# Patient Record
Sex: Female | Born: 1955 | Race: White | Hispanic: No | Smoking: Current every day smoker
Health system: Southern US, Community
[De-identification: ages and names within clinical notes are randomized; demographics above are authoritative.]

## PROBLEM LIST (undated history)

## (undated) DIAGNOSIS — F32A Depression, unspecified: Secondary | ICD-10-CM

## (undated) DIAGNOSIS — I429 Cardiomyopathy, unspecified: Secondary | ICD-10-CM

## (undated) DIAGNOSIS — R569 Unspecified convulsions: Secondary | ICD-10-CM

## (undated) DIAGNOSIS — E785 Hyperlipidemia, unspecified: Secondary | ICD-10-CM

## (undated) DIAGNOSIS — I639 Cerebral infarction, unspecified: Secondary | ICD-10-CM

## (undated) DIAGNOSIS — F419 Anxiety disorder, unspecified: Secondary | ICD-10-CM

## (undated) DIAGNOSIS — F329 Major depressive disorder, single episode, unspecified: Secondary | ICD-10-CM

## (undated) DIAGNOSIS — G2581 Restless legs syndrome: Secondary | ICD-10-CM

## (undated) DIAGNOSIS — Z87898 Personal history of other specified conditions: Secondary | ICD-10-CM

## (undated) DIAGNOSIS — G43909 Migraine, unspecified, not intractable, without status migrainosus: Secondary | ICD-10-CM

## (undated) DIAGNOSIS — Z720 Tobacco use: Secondary | ICD-10-CM

## (undated) DIAGNOSIS — G459 Transient cerebral ischemic attack, unspecified: Secondary | ICD-10-CM

## (undated) DIAGNOSIS — K219 Gastro-esophageal reflux disease without esophagitis: Secondary | ICD-10-CM

## (undated) HISTORY — DX: Unspecified convulsions: R56.9

## (undated) HISTORY — DX: Cerebral infarction, unspecified: I63.9

## (undated) HISTORY — DX: Cardiomyopathy, unspecified: I42.9

## (undated) HISTORY — DX: Hyperlipidemia, unspecified: E78.5

## (undated) HISTORY — DX: Migraine, unspecified, not intractable, without status migrainosus: G43.909

## (undated) HISTORY — DX: Personal history of other specified conditions: Z87.898

## (undated) HISTORY — DX: Restless legs syndrome: G25.81

## (undated) HISTORY — DX: Major depressive disorder, single episode, unspecified: F32.9

## (undated) HISTORY — DX: Depression, unspecified: F32.A

## (undated) HISTORY — DX: Transient cerebral ischemic attack, unspecified: G45.9

## (undated) HISTORY — DX: Tobacco use: Z72.0

## (undated) HISTORY — DX: Anxiety disorder, unspecified: F41.9

## (undated) HISTORY — DX: Gastro-esophageal reflux disease without esophagitis: K21.9

---

## 2004-11-10 ENCOUNTER — Emergency Department: Payer: Self-pay | Admitting: Internal Medicine

## 2006-02-01 ENCOUNTER — Other Ambulatory Visit: Payer: Self-pay

## 2006-02-01 ENCOUNTER — Inpatient Hospital Stay: Payer: Self-pay | Admitting: Internal Medicine

## 2007-03-21 ENCOUNTER — Inpatient Hospital Stay: Payer: Self-pay | Admitting: Internal Medicine

## 2007-03-21 ENCOUNTER — Other Ambulatory Visit: Payer: Self-pay

## 2007-03-22 ENCOUNTER — Other Ambulatory Visit: Payer: Self-pay

## 2007-04-23 ENCOUNTER — Other Ambulatory Visit: Payer: Self-pay

## 2007-04-23 ENCOUNTER — Emergency Department: Payer: Self-pay | Admitting: Emergency Medicine

## 2012-03-31 DIAGNOSIS — G459 Transient cerebral ischemic attack, unspecified: Secondary | ICD-10-CM

## 2012-03-31 HISTORY — DX: Transient cerebral ischemic attack, unspecified: G45.9

## 2012-07-06 ENCOUNTER — Inpatient Hospital Stay: Payer: Self-pay | Admitting: Internal Medicine

## 2012-07-06 DIAGNOSIS — I059 Rheumatic mitral valve disease, unspecified: Secondary | ICD-10-CM

## 2012-07-06 LAB — COMPREHENSIVE METABOLIC PANEL
Alkaline Phosphatase: 112 U/L (ref 50–136)
Anion Gap: 6 — ABNORMAL LOW (ref 7–16)
Bilirubin,Total: 0.4 mg/dL (ref 0.2–1.0)
Calcium, Total: 9.3 mg/dL (ref 8.5–10.1)
Chloride: 107 mmol/L (ref 98–107)
Co2: 28 mmol/L (ref 21–32)
EGFR (African American): >60
Osmolality: 284 (ref 275–301)
SGPT (ALT): 15 U/L (ref 12–78)
Sodium: 141 mmol/L (ref 136–145)
Total Protein: 7.7 g/dL (ref 6.4–8.2)

## 2012-07-06 LAB — CBC
HCT: 45.7 % (ref 35.0–47.0)
HGB: 15.5 g/dL (ref 12.0–16.0)
MCH: 31 pg (ref 26.0–34.0)
MCHC: 33.9 g/dL (ref 32.0–36.0)
MCV: 91 fL (ref 80–100)
Platelet: 202 10*3/uL (ref 150–440)
RBC: 5 10*6/uL (ref 3.80–5.20)
RDW: 14.2 % (ref 11.5–14.5)
WBC: 8.1 10*3/uL (ref 3.6–11.0)

## 2012-07-06 LAB — HCG, QUANTITATIVE, PREGNANCY: Beta Hcg, Quant.: 4 m[IU]/mL — ABNORMAL HIGH

## 2012-07-06 LAB — DRUG SCREEN, URINE
Amphetamines, Ur Screen: NEGATIVE (ref ?–1000)
Barbiturates, Ur Screen: NEGATIVE (ref ?–200)
Cannabinoid 50 Ng, Ur ~~LOC~~: NEGATIVE (ref ?–50)
Cocaine Metabolite,Ur ~~LOC~~: NEGATIVE (ref ?–300)
MDMA (Ecstasy)Ur Screen: NEGATIVE (ref ?–500)
Methadone, Ur Screen: NEGATIVE (ref ?–300)
Tricyclic, Ur Screen: NEGATIVE (ref ?–1000)

## 2012-07-06 LAB — URINALYSIS, COMPLETE
Bilirubin,UR: NEGATIVE
Leukocyte Esterase: NEGATIVE
Ph: 7 (ref 4.5–8.0)
Protein: NEGATIVE
RBC,UR: <1 /HPF (ref 0–5)
Specific Gravity: 1.003 (ref 1.003–1.030)
Squamous Epithelial: <1
WBC UR: NONE SEEN /HPF (ref 0–5)

## 2012-07-06 LAB — CK TOTAL AND CKMB (NOT AT ARMC): CK-MB: 0.8 ng/mL (ref 0.5–3.6)

## 2012-07-06 LAB — TROPONIN I: Troponin-I: <0.02 ng/mL

## 2012-07-07 ENCOUNTER — Telehealth: Payer: Self-pay

## 2012-07-07 LAB — LIPID PANEL
HDL Cholesterol: 44 mg/dL (ref 40–60)
Ldl Cholesterol, Calc: 196 mg/dL — ABNORMAL HIGH (ref 0–100)
Triglycerides: 142 mg/dL (ref 0–200)

## 2012-07-07 LAB — HEMOGLOBIN A1C: Hemoglobin A1C: 5.9 % (ref 4.2–6.3)

## 2012-07-07 NOTE — Telephone Encounter (Signed)
Message copied by [facility], Elier Zellars E on Wed Jul 07, 2012  2:10 PM ------      Message from: Coralee Rud      Created: Wed Jul 07, 2012  2:04 PM      Regarding: tcm       appt 07/09/12 with Dr Mariah Milling ------

## 2012-07-07 NOTE — Telephone Encounter (Signed)
tcm

## 2012-07-08 NOTE — Telephone Encounter (Signed)
TCM Pt d/c yesterday for [id number] infarct and cardiomyiopathy Denies worsening sob, edema, dizziness or CP Confirms compliance with medications as prescribed Has appt with [place] tomm Will call us prior to appt if needed

## 2012-07-09 ENCOUNTER — Encounter: Payer: Self-pay | Admitting: Cardiovascular Disease

## 2012-07-09 ENCOUNTER — Ambulatory Visit (INDEPENDENT_AMBULATORY_CARE_PROVIDER_SITE_OTHER): Payer: BC Managed Care – PPO | Admitting: Cardiovascular Disease

## 2012-07-09 VITALS — BP 122/72 | HR 89 | Ht 61.0 in | Wt 138.0 lb

## 2012-07-09 DIAGNOSIS — I639 Cerebral infarction, unspecified: Secondary | ICD-10-CM

## 2012-07-09 DIAGNOSIS — I6350 Cerebral infarction due to unspecified occlusion or stenosis of unspecified cerebral artery: Secondary | ICD-10-CM

## 2012-07-09 DIAGNOSIS — Z01812 Encounter for preprocedural laboratory examination: Secondary | ICD-10-CM

## 2012-07-09 DIAGNOSIS — I422 Other hypertrophic cardiomyopathy: Secondary | ICD-10-CM

## 2012-07-09 DIAGNOSIS — Z8673 Personal history of transient ischemic attack (TIA), and cerebral infarction without residual deficits: Secondary | ICD-10-CM | POA: Insufficient documentation

## 2012-07-09 DIAGNOSIS — R569 Unspecified convulsions: Secondary | ICD-10-CM | POA: Insufficient documentation

## 2012-07-09 DIAGNOSIS — R002 Palpitations: Secondary | ICD-10-CM

## 2012-07-09 DIAGNOSIS — E785 Hyperlipidemia, unspecified: Secondary | ICD-10-CM

## 2012-07-09 MED ORDER — CARVEDILOL 6.25 MG PO TABS: 6.2500 mg | ORAL_TABLET | Freq: Two times a day (BID) | ORAL | Status: DC

## 2012-07-09 NOTE — Assessment & Plan Note (Signed)
She does report palpitations and she wear a monitor at work early next week. Concerning for atrial fibrillation

## 2012-07-09 NOTE — Assessment & Plan Note (Signed)
History suggest four strokes with good recovery dating back to 2006. Etiology is still uncertain. We will try to obtain a TEE from [facility].I'm concerned about PFO. She does have palpitations raising the concern of atrial fibrillation. We had a long discussion with her about this and she will wear a today monitor, likely will need 30 day monitor as well. She will continue on aspirin and Plavix. If she has stroke on aspirin Plavix, would likely need warfarin. Carotid scan showing no significant stenosis.

## 2012-07-09 NOTE — Assessment & Plan Note (Addendum)
Moderately depressed ejection fraction on recent echocardiogram. We'll order a ischemic workup. Treadmill Myoview ordered. Higher risk given her smoking history.she will continue lisinopril, we will start Coreg 6.25 mg twice a day. We have discussed heart failure with her and she will watch her fluid and salt intake.

## 2012-07-09 NOTE — Patient Instructions (Addendum)
We will schedule you for a stress test on Tuesday 4/15 in the AM We will order a 48 hour monitor for CVA, palpitations  Please start coreg 6.25 mg twice a day (start after your stress test) Continue all other medications  Please call us if you have new issues that need to be addressed before your next appt.  Your physician wants you to follow-up in: 1 month.

## 2012-07-09 NOTE — Assessment & Plan Note (Signed)
Very high cholesterol and LDL. We have suggested she stay on her statin. We'll recheck her numbers in several months.

## 2012-07-09 NOTE — Progress Notes (Signed)
Patient ID: Vicki Rosales, female    DOB: January 03, 1956, 57 y.o.   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 She seemed to recover well. She was not on aspirin or Plavix at the time.  Second stroke in December 2008 with left-sided deficits and speech problem. Again seemed to recover well. She was not on aspirin or Plavix at the time.she had seizures after the stroke and was started on Keppra. She did not tolerate Aggrenox and was discharged on aspirin, Plavix.  She stopped taking aspirin and Plavix on her own Another stroke in 2009 care for at [facility]. She reports having TEE done at this hospital  Recent admission to the hospital 07/06/2012 for acute ischemia of the left posterior parietal lobe, right-sided deficits of her hand with speech issues Ejection fraction on echo was 35-40%, mild to moderate diffuse hypokinesis, diastolic dysfunction, normal right ventricular systolic pressures. She denies any significant shortness of breath, chest pain.  EKG in the hospital showed normal sinus rhythm with left anterior fascicular block, poor R-wave progression to the anterior precordial leads  EKG today shows normal sinus rhythm with rate 89 beats per minuteWith poor R-wave progression, left anterior fascicular block  She has hyperlipidemia with LDL 196, total cholesterol 268 MRI of the brain and neck did not show any significant carotid arterial disease She was discharged on aspirin and Plavix. Symptoms seemed to resolve after 4 hours  Outpatient Encounter Prescriptions as of 07/09/2012  Medication Sig Dispense Refill  . aspirin 81 MG tablet Take 81 mg by mouth  daily.      . clopidogrel (PLAVIX) 75 MG tablet Take 75 mg by mouth daily.      Marland Kitchen lisinopril (PRINIVIL,ZESTRIL) 10 MG tablet Take 10 mg by mouth daily.      Marland Kitchen LORazepam (ATIVAN) 0.5 MG tablet Take 0.5 mg by mouth every 8 (eight) hours.      . nicotine (NICODERM CQ - DOSED IN MG/24 HOURS) 14 mg/24hr patch Place 1 patch onto the skin daily.      Marland Kitchen omeprazole (PRILOSEC) 20 MG capsule Take 20 mg by mouth daily.      Marland Kitchen rOPINIRole (REQUIP) 3 MG tablet Take 0.5 mg by mouth at bedtime.      . sertraline (ZOLOFT) 100 MG tablet Take 100 mg by mouth daily.       . simvastatin (ZOCOR) 40 MG tablet Take 40 mg by mouth every evening.         Review of Systems  Constitutional: Negative.   HENT: Negative.   Eyes: Negative.   Respiratory: Negative.   Cardiovascular: Negative.   Gastrointestinal: Negative.   Musculoskeletal: Negative.   Skin: Negative.   Neurological: Negative.        Recent right arm  and speech deficits from stroke, resolved  Psychiatric/Behavioral: Negative.   All other systems reviewed and are negative.    BP 122/72  Pulse 89  Ht 5\' 1"  (1.549 m)  Wt 138 lb (62.596 kg)  BMI 26.09 kg/m2  Physical Exam  Nursing note and vitals reviewed. Constitutional: She is oriented to person, place, and time. She appears well-developed and well-nourished.  HENT:  Head: Normocephalic.  Nose: Nose normal.  Mouth/Throat: Oropharynx is clear and moist.  Eyes: Conjunctivae are normal. Pupils are equal, round, and reactive to light.  Neck: Normal range of motion. Neck supple. No JVD present.  Cardiovascular: Normal rate, regular rhythm, S1 normal, S2 normal, normal heart sounds and intact distal pulses.  Exam reveals no gallop and no friction rub.   No murmur heard. Pulmonary/Chest: Effort normal and breath sounds normal. No respiratory distress. She has no wheezes. She has no rales. She exhibits no tenderness.  Abdominal: Soft. Bowel sounds are normal. She exhibits no distension. There is  no tenderness.  Musculoskeletal: Normal range of motion. She exhibits no edema and no tenderness.  Lymphadenopathy:    She has no cervical adenopathy.  Neurological: She is alert and oriented to person, place, and time. Coordination normal.  Skin: Skin is warm and dry. No rash noted. No erythema.  Psychiatric: She has a normal mood and affect. Her behavior is normal. Judgment and thought content normal.    Assessment and Plan

## 2012-07-09 NOTE — Assessment & Plan Note (Signed)
She denies any recent seizure

## 2012-07-19 ENCOUNTER — Ambulatory Visit: Payer: Self-pay | Admitting: Cardiovascular Disease

## 2012-07-19 DIAGNOSIS — R079 Chest pain, unspecified: Secondary | ICD-10-CM

## 2012-07-20 ENCOUNTER — Telehealth: Payer: Self-pay | Admitting: Physician Assistant

## 2012-07-20 ENCOUNTER — Other Ambulatory Visit: Payer: Self-pay

## 2012-07-20 DIAGNOSIS — I639 Cerebral infarction, unspecified: Secondary | ICD-10-CM

## 2012-07-20 DIAGNOSIS — R002 Palpitations: Secondary | ICD-10-CM

## 2012-07-20 NOTE — Telephone Encounter (Signed)
Patient called after-hours regarding itching all over. This started yesterday evening. She thought it would get better by now but it hasn't. She reports undergoing pharmacologic stress testing yesterday. Medications include zoloft (chronic for many years), and more recently addition of Plavix, Lisinopril and Simvastatin on 07/06/2012. She has not changed soaps or laundry detergent lately. No other known allergies. No respiratory distress, wheezing, SOB, throat/lip/mouth swelling, visible skin reaction, swelling, redness, or any other symptoms, just diffuse itching. I advised she take 25mg  of oral benadryl now with 40mg  of oral pepcid. If itching is not better within 1 hour, she is to take an additional 25mg  of benadryl. I also advised to proceed to ER if she develops any warning signs including progression of reaction, SOB/resp symptoms as above, or any other symptoms concerning to her. As far as evening medicines go, she is due for lisinopril and simvastatin - I instructed her to hold these medications until we can get the itching under control. She is to call the office in the AM for an update on how rash is doing and further instructions on medication continuation. She read these instructions back to me and verbalized understanding and gratitude.

## 2012-07-21 ENCOUNTER — Telehealth: Payer: Self-pay | Admitting: *Deleted

## 2012-07-21 NOTE — Telephone Encounter (Signed)
Pt calling for stress test results °

## 2012-08-03 ENCOUNTER — Encounter (INDEPENDENT_AMBULATORY_CARE_PROVIDER_SITE_OTHER): Payer: BC Managed Care – PPO

## 2012-08-03 DIAGNOSIS — R002 Palpitations: Secondary | ICD-10-CM

## 2012-08-09 ENCOUNTER — Other Ambulatory Visit: Payer: Self-pay

## 2012-08-09 MED ORDER — CLOPIDOGREL BISULFATE 75 MG PO TABS: 75.0000 mg | ORAL_TABLET | Freq: Every day | ORAL | Status: DC

## 2012-08-09 NOTE — Telephone Encounter (Signed)
Refilled Clopidogrel sent to [facility] pharmacy.

## 2012-08-16 ENCOUNTER — Encounter: Payer: Self-pay | Admitting: Cardiovascular Disease

## 2012-08-16 ENCOUNTER — Ambulatory Visit (INDEPENDENT_AMBULATORY_CARE_PROVIDER_SITE_OTHER): Payer: BC Managed Care – PPO | Admitting: Cardiovascular Disease

## 2012-08-16 VITALS — BP 120/80 | HR 81 | Ht 60.0 in | Wt 138.2 lb

## 2012-08-16 DIAGNOSIS — E785 Hyperlipidemia, unspecified: Secondary | ICD-10-CM

## 2012-08-16 DIAGNOSIS — I639 Cerebral infarction, unspecified: Secondary | ICD-10-CM

## 2012-08-16 DIAGNOSIS — I6350 Cerebral infarction due to unspecified occlusion or stenosis of unspecified cerebral artery: Secondary | ICD-10-CM

## 2012-08-16 DIAGNOSIS — R002 Palpitations: Secondary | ICD-10-CM

## 2012-08-16 DIAGNOSIS — F17200 Nicotine dependence, unspecified, uncomplicated: Secondary | ICD-10-CM | POA: Insufficient documentation

## 2012-08-16 NOTE — Assessment & Plan Note (Signed)
We have encouraged her to continue to work on weaning her cigarettes and smoking cessation. She will continue to work on this and does not want any assistance with chantix.  

## 2012-08-16 NOTE — Assessment & Plan Note (Addendum)
encouraged her to stay on simvastatin

## 2012-08-16 NOTE — Progress Notes (Signed)
Patient ID: Vicki Rosales, female    DOB: 03/13/1956, 57 y.o.   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 notes indicate history of seizure,  long history of smoking from age [age], total of 37 years   initial stroke in 2007 she had difficulty with speech. She seemed to recover well. She was not on aspirin or Plavix at the time. Second stroke in December 2008 with left-sided deficits and speech problem. Again seemed to recover well. She was not on aspirin or Plavix at the time.she had seizures after the stroke and was started on Keppra. She did not tolerate Aggrenox and was discharged on aspirin, Plavix.  She stopped taking aspirin and Plavix on her own. Another stroke in 2009, managed at [facility]. She reports having TEE done at this hospital. Details unavailable  Recent admission  07/06/2012 for acute ischemia of the left posterior parietal lobe, right-sided deficits of her hand with speech issues, documented on MRA Ejection fraction on echo was 35-40%, mild to moderate diffuse hypokinesis, diastolic dysfunction, normal right ventricular systolic pressures. Bubble study not performed. She was discharged on aspirin and Plavix. Symptoms seemed to resolve after 4 hours MRI of the brain and neck did not show any significant carotid arterial disease  She denies any significant shortness of breath, chest pain. Overall she reports on today's visit that she is doing well. Significant stress at home from financial issues Stress test in April 2014 showed no ischemia, ejection fraction greater than 50% She continues to smoke one half pack per day  EKG in the hospital showed normal sinus rhythm with left anterior fascicular block, poor R-wave progression to the anterior precordial leads  EKG today shows normal sinus rhythm with rate 81 beats per minute,  with poor R-wave progression, left anterior fascicular block  She has hyperlipidemia with LDL 196, total cholesterol 268    Outpatient Encounter Prescriptions as of 08/16/2012  Medication Sig Dispense Refill  . aspirin 81 MG tablet Take 81 mg by mouth daily.      . carvedilol (COREG) 6.25 MG tablet Take 1 tablet (6.25 mg total) by mouth 2 (two) times daily.  180 tablet  3  . clopidogrel (PLAVIX) 75 MG tablet Take 1 tablet (75 mg total) by mouth daily.  30 tablet  3  . lisinopril (PRINIVIL,ZESTRIL) 10 MG tablet Take 10 mg by mouth daily.      Marland Kitchen LORazepam (ATIVAN) 0.5 MG tablet Take 0.5 mg by mouth every 8 (eight) hours.      . nicotine (NICODERM CQ - DOSED IN MG/24 HOURS) 14 mg/24hr patch Place 1 patch onto the skin daily.      Marland Kitchen omeprazole (PRILOSEC) 20 MG capsule Take 20 mg by mouth daily.      Marland Kitchen rOPINIRole (REQUIP) 3 MG tablet Take 0.5 mg by mouth at bedtime.      . sertraline (ZOLOFT) 100 MG tablet Take 100 mg by mouth daily.       . simvastatin (ZOCOR) 40 MG tablet Take 40 mg by mouth every evening.       No facility-administered encounter medications on file as of 08/16/2012.   And  Review of Systems  Constitutional: Negative.   HENT: Negative.   Eyes: Negative.   Respiratory: Negative.   Cardiovascular: Negative.   Gastrointestinal: Negative.   Musculoskeletal: Negative.   Skin: Negative.  Neurological: Negative.        Recent right arm  and speech deficits from stroke, resolved  Psychiatric/Behavioral: The patient is nervous/anxious.   All other systems reviewed and are negative.    BP 100/60  Ht 5' (1.524 m)  Wt 138 lb 4 oz (62.71 kg)  BMI 27 kg/m2  Physical Exam  Nursing note and vitals reviewed. Constitutional: She is oriented to person, place, and time. She appears well-developed and well-nourished.  HENT:  Head: Normocephalic.  Nose: Nose normal.  Mouth/Throat: Oropharynx is clear and moist.  Eyes: Conjunctivae are normal. Pupils are equal, round, and  reactive to light.  Neck: Normal range of motion. Neck supple. No JVD present.  Cardiovascular: Normal rate, regular rhythm, S1 normal, S2 normal, normal heart sounds and intact distal pulses.  Exam reveals no gallop and no friction rub.   No murmur heard. Pulmonary/Chest: Effort normal and breath sounds normal. No respiratory distress. She has no wheezes. She has no rales. She exhibits no tenderness.  Abdominal: Soft. Bowel sounds are normal. She exhibits no distension. There is no tenderness.  Musculoskeletal: Normal range of motion. She exhibits no edema and no tenderness.  Lymphadenopathy:    She has no cervical adenopathy.  Neurological: She is alert and oriented to person, place, and time. Coordination normal.  Skin: Skin is warm and dry. No rash noted. No erythema.  Psychiatric: She has a normal mood and affect. Her behavior is normal. Judgment and thought content normal.    Assessment and Plan

## 2012-08-16 NOTE — Assessment & Plan Note (Signed)
We will try to obtain TEE records to rule out PFO or ASD. If she has additional episodes of CVA, will require warfarin. Encouraged her to stay on aspirin and Plavix

## 2012-08-16 NOTE — Patient Instructions (Addendum)
You are doing well. No medication changes were made.  Please call us if you have new issues that need to be addressed before your next appt.  Your physician wants you to follow-up in: 12 months.  You will receive a reminder letter in the mail two months in advance. If you don't receive a letter, please call our office to schedule the follow-up appointment. 

## 2012-12-07 ENCOUNTER — Other Ambulatory Visit: Payer: Self-pay | Admitting: Cardiovascular Disease

## 2012-12-07 NOTE — Telephone Encounter (Signed)
Refilled Plavix sent to [facility] pharmacy.

## 2013-04-11 ENCOUNTER — Other Ambulatory Visit: Payer: Self-pay | Admitting: *Deleted

## 2013-04-11 ENCOUNTER — Other Ambulatory Visit: Payer: Self-pay | Admitting: Cardiovascular Disease

## 2013-04-11 MED ORDER — CLOPIDOGREL BISULFATE 75 MG PO TABS: ORAL_TABLET | ORAL | Status: DC

## 2013-04-11 NOTE — Telephone Encounter (Signed)
Requested Prescriptions   Signed Prescriptions Disp Refills  . clopidogrel (PLAVIX) 75 MG tablet 30 tablet 3    Sig: TAKE ONE TABLET BY MOUTH ONCE DAILY    Authorizing Provider: Antonieta IbaGOLLAN, TIMOTHY J    Ordering User: Kendrick FriesLOPEZ, MARINA C

## 2013-11-11 ENCOUNTER — Other Ambulatory Visit: Payer: Self-pay | Admitting: Cardiovascular Disease

## 2013-11-11 ENCOUNTER — Telehealth: Payer: Self-pay

## 2013-11-11 NOTE — Telephone Encounter (Signed)
Pt needs to talk about her rx clopidogrel, and has some questions.

## 2013-11-11 NOTE — Telephone Encounter (Signed)
Spoke w pt.  She states that she is out of work and unable to afford her meds.  Due to this, she has not taken her Plavix in some time and cannot afford an appt.  Advised pt that she is overdue for an appt, as she has not been seen in a year and half, and although we cannot refill or change her meds, we can definitely work w/ her on an appt.  She will call back when she has a chance to check her schedule, as she has some upcoming interviews that she needs to work around.

## 2014-07-21 NOTE — H&P (Signed)
PATIENT NAME:  Vicki Rosales, Vicki Rosales MR#:  [id number] DATE OF BIRTH:  14-Oct-1955  DATE OF ADMISSION:  07/06/2012  PRIMARY CARE PHYSICIAN: Dr. Dossie Arbour.   CHIEF COMPLAINT: Right hand weakness, incoordination and slurred speech.   HISTORY OF PRESENTING ILLNESS: A 59 year old Caucasian female patient with prior CVA, tobacco abuse presents to the Emergency Room complaining of acute onset of right hand weakness and incoordination, along with slurred speech, which started about 7 a.m. today morning. This lasted 4 hours and resolved slowly after arriving at the Emergency Room. The patient felt like she could not coordinate with her right arm. Although she could lift her hands up to tie a hairband, she could not do it secondary to weakness in her hand and incoordination. Her slurred speech started immediately after that and lasted until she arrived at the Emergency Room. Did not have any other focal neurological deficits. No dysphagia, hearing loss, change in vision, fall, syncope, nausea, vomiting, headache, trouble breathing. The patient was last seen here at the hospital in December 2008 for an acute right frontotemporal ischemic stroke. The patient after the stroke had seizures, started on Keppra. The patient did not tolerate Aggrenox at that time and was discharged on aspirin, Plavix and Keppra.   The patient stopped taking the Plavix and Keppra, as she did not have any further seizures and was doing well. She reports that a few months back she had 2 episodes of syncope with some lightheadedness but nothing lately.   PAST MEDICAL HISTORY: 1.  CVA.  2.  Seizures.  3.  Hyperlipidemia.  4.  Tobacco abuse.  5.  Restless leg syndrome.  6.  Ocular migraines.   PAST SURGICAL HISTORY: None.   ALLERGIES: No known drug allergies.   SOCIAL HISTORY: Smokes half pack of cigarettes for over 30 years now. No alcohol. No illicit drugs. She works in Presenter, broadcasting at [employer].   FAMILY HISTORY: Mother had  hypertension. Sister has coronary artery disease and a stent placed at age 45.   CURRENT MEDICATIONS:  1.  Lorazepam 0.5 mg oral once a day as needed for anxiety.  2.  Omeprazole 20 mg oral once a day.  3.  Requip 3 mg half tablet oral once a day at bedtime.  4.  Sertraline 100 mg oral once a day.   REVIEW OF SYSTEMS:  CONSTITUTIONAL: No fever, fatigue, weakness.  EYES: No blurred vision, pain, redness.  ENT: No tinnitus, ear pain, hearing loss.  RESPIRATORY: No cough, wheeze, hemoptysis.  CARDIOVASCULAR: No chest pain, orthopnea, edema.  GASTROINTESTINAL: No nausea, vomiting, diarrhea, abdominal pain.  GENITOURINARY: No dysuria, hematuria, frequency.  ENDOCRINE: No polyuria, nocturia, thyroid problems.  HEMATOLOGY/LYMPHATIC: No anemia, easy bruising, bleeding.  INTEGUMENTARY: No acne, rash, lesions.  MUSCULOSKELETAL: No arthritis or back pain.  NEUROLOGICAL: Had right hand weakness, incoordination, slurred speech, which are resolved. Has a seizure disorder, but no seizures over the past 2 years.  PSYCHIATRIC: Has some anxiety. No depression.   PHYSICAL EXAMINATION: VITAL SIGNS: Temperature 98.6, pulse 78, respirations 20, blood pressure 147/81, saturating 100% on room air.  GENERAL: Moderately built Caucasian female patient lying in bed, comfortable, conversational, cooperative with exam.  PSYCHIATRIC: Alert, oriented x 3. Mood and affect appropriate. Judgment intact.  HEENT: Atraumatic, normocephalic. Oral mucosa moist and pink. External ears and nose normal. No pallor. No icterus. Pupils bilaterally equal and reactive to light.  NECK: Supple. No thyromegaly. No palpable lymph nodes. Trachea midline. No carotid bruit, JVD.  CARDIOVASCULAR: S1, S2. Regular rate  and rhythm without any murmurs. Peripheral pulses 2+.  RESPIRATORY: Normal work of breathing. Clear to auscultation on both sides.  GASTROINTESTINAL: Soft, nontender. Bowel sounds present. No hepatosplenomegaly palpable.   GENITOURINARY: No CVA tenderness or bladder distention.  SKIN: Warm and dry. No petechiae, rash, ulcers.  MUSCULOSKELETAL: No joint swelling, redness, effusion of the large joints. Normal muscle tone.  NEUROLOGICAL: Motor strength 5/5 in upper and lower extremities. Sensation to fine touch intact all over. Cranial nerves II-XII intact. Cerebellar function is intact. Gait not tested.  LYMPHATIC: No cervical lymphadenopathy.   RADIOGRAPHIC DATA: EKG shows normal sinus rhythm with left anterior fascicular block. No acute ST and T wave changes.   CT scan of the head shows encephalomalacia from bilateral old strokes. Her last MRI of the brain was done in 2009 and showed acute right parietal stroke. An MRI in 2008 showed acute right frontal and cerebellar stroke.   ASSESSMENT AND PLAN: 1.  Acute cerebrovascular accident with signs of right arm numbness, incoordination and slurred speech. Suspicion for multiple strokes. Also, the CAT scan of the head shows bilateral old strokes, which could have happened between 2009 to now. The patient has been taking only aspirin instead of aspirin and Plavix. We will admit the patient on a tele floor with neuro checks, consult neurology for further input of the case. We will get an MRI and MRA of the head and neck. We will also get an echocardiogram, restart the patient on aspirin, Plavix, statin. I have advised the patient to quit smoking, counseled her for greater than 3 minutes and stressed its involvement with strokes.  2.  Tobacco abuse.  3.  Hyperlipidemia. The patient is not on any statins. We will restart her on statin medication and check a fasting lipid profile in the morning.  4.  History of seizures. The patient has been off Keppra and has been seizure-free. We will monitor at this time.  5.  Deep vein thrombosis prophylaxis with Lovenox.   CODE STATUS: FULL CODE.   TIME SPENT TODAY ON THIS CASE: Forty-five minutes.     ____________________________ Molinda BailiffSrikar R. Kahlen Morais, MD srs:lg D: 07/06/2012 13:07:27 ET T: 07/06/2012 14:29:23 ET JOB#: [id number]  cc: Wardell HeathSrikar R. Tascha Casares, MD, <Dictator> [facility] Dr. Haydee MonicaShah  Ayme Short R Rochell Mabie MD ELECTRONICALLY SIGNED 07/12/2012 19:09

## 2014-07-21 NOTE — Consult Note (Signed)
PATIENT NAME:  Vicki GarbeCAMPBELL, Arlinda R MR#:  [id number] DATE OF BIRTH:  [birth year]  DATE OF CONSULTATION:  07/06/2012  REFERRING PHYSICIAN:  Milagros LollSrikar Sudini, MD CONSULTING PHYSICIAN:  Oanh Devivo K. Sherryll BurgerShah, MD  REASON FOR CONSULTATION: Recurrent stroke.   HISTORY OF PRESENT ILLNESS: The patient is a 59 year old right-handed Caucasian female who has a history of recurrent stroke. Her first stroke was in 2007 causing right cerebellar/right frontal infarct. She had other stroke in early 2008 involving right parietal lobe. This is based on the radiology report; I was not able to access her original images.   The patient had another stroke in 2009 but at that time [facility].   At baseline, the patient does have some left-sided weakness.   This morning, on 07/06/2012, the patient woke up and felt fine and was able to go to the restroom, but around 7:00, she felt like when she was combing her hair, she did not have good control of her right hand and she also felt like she had some slurred speech. She called 911 and came to the ER. She did not receive TPA as she came late and she was rapidly improving during her EMS travel.   When she was in the ER, she felt like her coordination in the right hand was not quite normal, but other than that she did not have any other symptoms.   The patient does have occasional palpitations and feeling of anxiety. The patient was started on aspirin and Plavix after her last stroke in 2009, but she has stopped taking Plavix and was only taking aspirin.   The patient also had a history of seizures in which was described as a feeling of hot, nausea, feeling of generalized weakness. She did not have shaking or jerking episode. Her eye stays open. She has just a dazed feeling. Typical seizure lasts for 1 to 2 minutes.   The patient had only 2 seizures so far. Initially she was started on levetiracetam or Keppra but then she stopped it.   The  patient also has a history of restless leg syndrome and she is taking Requip.   The patient also has a history of ocular migraine.   The patient has taken birth control pills in her young years. She does have high blood pressure and she does smoke.   She does have some hyperlipidemia and was started on a statin medication in 2009, but she stopped taking them.   PAST MEDICAL HISTORY: Significant for stroke seizure, hyperlipidemia, restless leg syndrome, tobacco abuse and ocular migraine.   PAST SURGICAL HISTORY: Negative.   SOCIAL HISTORY:  Significant in that she smokes 1/2 pack of cigarettes. She has been smoking for 30 years or so.   She does not drink alcohol. Does not do illicit drugs. 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    FAMILY HISTORY: Significant that the mother had hypertension. Sister had coronary artery disease  and a stent placed at age of 948.   MEDICATIONS: I reviewed her home medication list.   REVIEW OF SYSTEMS:  A 10-system review of systems was asked and was found to be negative.   PHYSICAL EXAMINATION: VITAL SIGNS: Temperature 97.3, pulse 77, respiratory rate 17, blood pressure 150/85, pulse oximetry 94% on room air.  GENERAL: She is a middle-aged Caucasian female lying in bed accompanied by her daughter in the chair.  LUNGS: Clear to auscultation.  HEART: S1, S2.  NECK:  Carotid exam did  not reveal any bruit.  MENTAL STATUS EXAMINATION: She was alert, oriented, followed two-step unrelated commands. I did not feel like she had any language impairment. She did not have any neurological neglect. She did not have any dysarthria.   Her attention, concentration and memory seems to be appropriate for her age and medical condition.   On her cranial nerves, her pupils are equal, round, and reactive. Extraocular movements are intact. Her face is symmetric. Tongue was midline. Facial sensations were intact.   On her sensory exam was intact to light touch. Her deep  tendon reflexes were symmetric, 2+.   On her motor exam, she seems to have normal strength but she had mild in-coordination with finger-to-nose on the right compared to the left.   I did not check her gait.   LABORATORY, DIAGNOSTIC AND RADIOLOGICAL DATA: On her CT scan of the head, she does have an infarct on her right cerebellum, right posterior frontal subcortical region and right insular cortex and frontal operculum and left occipital region.   ASSESSMENT AND PLAN:  1.  Stroke involving the left hemisphere. I would like to obtain usual stroke workup with MRI of the brain and MRA, carotid Doppler, echocardiogram, Holter monitoring, lipid panel, hemoglobin A1c, etc.   The patient has multiple vascular risk factors including medication noncompliance, hypertension, smoking, hyperlipidemia, which is not being treated.   The patient has had multiple embolic-looking infarcts in the last years such as involving her left occipital lobe which seems to be new since 2009, but she also had right cerebellar, right insular cortex and right frontal operculum and right posterior frontal lobe infarcts.   This time it looks like the patient has had left hemispheric infarct.   If the patient's echocardiogram is okay, the patient should be considered for transesophageal echocardiogram.  I did offer that to the patient but she mentioned that she had this done at the [facility] and she preferred not to go for it.   I also offered hypercoagulable workup to her, but she is quite worried about the  cost and she would like to hold off on that as well.   I do believe that the patient's recurrent stroke might be from her uncontrolled vascular risk factors.   The patient should continue aspirin and Plavix for now, though there is no strong evidence to suggest that aspirin and Plavix dual antiplatelet therapy is any more beneficial than single agent, and it might increase the risk of intracerebral hemorrhage  in patients with small vessel disease.   At present, there is not enough evidence to start her on anticoagulation, but we should keep a  close eye on her Holter monitor.   There is recent data suggesting long-term monitoring might be a more useful even as an outpatient after discharge to look for paroxysmal atrial fibrillation.   The patient was advised on importance of calling 911 with signs and symptoms of stroke, so she can be a candidate for TPA.   The patient NIHSS (NIH stroke scale) at the time of  her exam is 1, due to incoordination of her right hand.   The patient should also take statin on a regular basis. I talked to the patient about the importance of stopping smoking.   2.  Restless leg syndrome.  I talked to the patient about iron deficiency anemia as one of the risk factors for restless leg syndrome and she had questions with the Requip can be the cause of her recurrent  stroke, which I do not think so. I did talk to her briefly about dopamine agonist side effects.   3.  History of recurrent seizures, but she has not had any spell 2009, so it is okay to for her not to continue to take levetiracetam or Keppra.   4.  History of ocular migraines which is one of the risk factors for stroke. I explained to the patient.   I will follow this patient with you. Feel free to contact me with any further questions.    ____________________________ Durene Cal. Sherryll Burger, MD hks:cc D: 07/06/2012 20:20:47 ET T: 07/06/2012 20:40:53 ET JOB#: [id number]  cc: Jenafer Winterton K. Sherryll Burger, MD, <Dictator> Durene Cal [facility] MD ELECTRONICALLY SIGNED 07/08/2012 7:57

## 2014-07-21 NOTE — Consult Note (Signed)
Brief Consult Note: Diagnosis: stroke, right UE ataxia, speech impairment (improved).   Patient was seen by consultant.   Consult note dictated.   Comments: - h/o previous multiple stroke - left occipital, right insular cortex, right frontal operculum, rt cerebellum, right corona radiata etc. - agree with stroke w/up - MRI, MRA, carotid US, telemetry, fasting lipid panel, HgA1c, ECHO. - offered TEE and hypercoag w/up but pt declined. - risk factors, smoking, medication non-compliance, HLD, HTN, previous strokes, migraine with aura, use of contraceptive pills in past - OK with ASA + Plavix (no added benefits of dual antiplatelet therapy and slight increased risk of intracranial hemorrhage discussed) - add statin, did smoking cessation couselling 2) h/o sz - off keppra 3) RLS - on requip 4) h/o occular migraine.  Electronic Signatures: Jolene ProvostShah, Kylle Lall Kalpeshkumar (MD)  (Signed 08-Apr-14 20:50)  Authored: Brief Consult Note   Last Updated: 08-Apr-14 20:50 by Jolene ProvostShah, Jayron Maqueda Kalpeshkumar (MD)

## 2014-07-21 NOTE — Discharge Summary (Signed)
PATIENT NAME:  Vicki GarbeCAMPBELL, Tais R MR#:  [id number] DATE OF BIRTH:  10-Aug-1955  DATE OF ADMISSION:  07/06/2012 DATE OF DISCHARGE:  07/07/2012  ADMISSION DIAGNOSIS:  Cerebral infarction.   DISCHARGE DIAGNOSES: 1.  Acute ischemia of the left posterior parietal lobe.  2.  Cardiomyopathy, new, ejection fraction of 35%.  3.  History of seizure disorder.   CONSULTATIONS:  Neurology.   PERTINENT LABORATORY DATA AT DISCHARGE:  LDL was 196, HDL 44, VLDL 28, triglycerides 142, cholesterol 268.   MRI of the brain showed acute ischemia, left posterior parietal lobe.   MRA of the neck showed no hemodynamically significant stenosis.  MRA of the brain was a normal exam.   A 2-D echocardiogram showed an EF of 35% to 40% with mild to moderately decreased global left ventricular systolic function, mild to moderate diffuse hypokinesis, impaired RV relaxation.   HOSPITAL COURSE:  The patient is a 59 year old female who was brought in due to symptoms of a CVA.  For further details, please refer to Dr. Eddie NorthSudini's history and physical.  1.  Acute cerebrovascular accident.  The patient's symptoms have subsided, however she did suffer an acute stroke on the left side as evidenced by the MRI.  She was already on aspirin.  We continued aspirin and Plavix per neurology consult and she was supposed to be on a statin at home and was not taking it so we prescribed her a statin.  Her LDL is not at goal.  Her MRA showed no significant stenosis.  She will follow up with neurology as an outpatient.  There was concern that given her young age that she should have a thromboembolic work-up at this time.  She did not want this and said that she will follow up with neurology.  2.  Cardiomyopathy, EF of 35% to 40% by echocardiogram.  This is a new diagnosis.  We started a low-dose ACE inhibitor and scheduled a follow-up with cardiology.  I have spoken to the patient in length about her new diagnosis of cardiomyopathy and urged to see  the cardiologist.  Currently, she is asymptomatic.  3.  History of seizure disorder, which is stable.  4.  History of hyperlipidemia.  Her LDL is not at goal.    DISCHARGE MEDICATIONS: 1.  Requip 3 mg 1/2 tablet at bedtime.  2.  Zoloft 100 mg daily.  3.  Omeprazole 20 mg daily.  4.  Ativan 0.5 mg daily as needed.  5.  Simvastatin 40 mg daily.  6.  Plavix 75 mg daily.  7.  Nicotine patch 14 mg daily.  8.  Lisinopril 10 mg daily.  9.  Aspirin 81 mg daily.   DISCHARGE DIET:  Low sodium, regular consistency.   DISCHARGE ACTIVITY:  No exertional activity.   DISCHARGE FOLLOW-UP:  The patient will follow up with Dr. Cristopher PeruHemang Shah in 2 weeks and Dr. Mariah MillingGollan in one week and Dr. Dossie Arbourrissman in 1 to 2 weeks.    TIME SPENT:  Approximately 35 minutes.      ____________________________ Janyth ContesSital P. Juliene PinaMody, MD spm:ea D: 07/07/2012 15:55:51 ET T: 07/07/2012 23:49:15 ET JOB#: [id number]  cc: Tyler Cubit P. Juliene PinaMody, MD, <Dictator> Dr. Dossie Arbourrissman Dr. Philis PiqueGollan Hemang K. Sherryll BurgerShah, MD Janyth ContesSITAL P Kayd Launer MD ELECTRONICALLY SIGNED 07/08/2012 13:28

## 2014-10-19 ENCOUNTER — Other Ambulatory Visit: Payer: Self-pay

## 2014-10-19 ENCOUNTER — Encounter: Payer: Self-pay | Admitting: Emergency Medicine

## 2014-10-19 ENCOUNTER — Emergency Department
Admission: EM | Admit: 2014-10-19 | Discharge: 2014-10-19 | Disposition: A | Discharge status: Home / Self Care | Payer: Self-pay | Attending: Student | Admitting: Student

## 2014-10-19 DIAGNOSIS — Z720 Tobacco use: Secondary | ICD-10-CM | POA: Insufficient documentation

## 2014-10-19 DIAGNOSIS — Z79899 Other long term (current) drug therapy: Secondary | ICD-10-CM | POA: Insufficient documentation

## 2014-10-19 DIAGNOSIS — Z7902 Long term (current) use of antithrombotics/antiplatelets: Secondary | ICD-10-CM | POA: Insufficient documentation

## 2014-10-19 DIAGNOSIS — Z7982 Long term (current) use of aspirin: Secondary | ICD-10-CM | POA: Insufficient documentation

## 2014-10-19 DIAGNOSIS — Z792 Long term (current) use of antibiotics: Secondary | ICD-10-CM | POA: Insufficient documentation

## 2014-10-19 DIAGNOSIS — J0110 Acute frontal sinusitis, unspecified: Secondary | ICD-10-CM | POA: Insufficient documentation

## 2014-10-19 HISTORY — DX: Cerebral infarction, unspecified: I63.9

## 2014-10-19 LAB — GLUCOSE, CAPILLARY: GLUCOSE-CAPILLARY: 107 mg/dL — AB (ref 65–99)

## 2014-10-19 MED ORDER — AMOXICILLIN-POT CLAVULANATE 875-125 MG PO TABS: 1.0000 | ORAL_TABLET | Freq: Two times a day (BID) | ORAL | Status: DC

## 2014-10-19 NOTE — ED Notes (Signed)
Past week, c/o cough chest congestion, some fullness in head,  2 episodes of dizziness past few days, has had some fever lungs clear, lungs sounds coarse

## 2014-10-19 NOTE — ED Notes (Signed)
Patient presents to the ED with cough, congestion, and occasional dizziness x 1 week.  Patient ambulatory to registration desk with steady gait and no obvious distress.

## 2014-10-19 NOTE — ED Provider Notes (Signed)
[facility] Emergency Department Provider Note  ____________________________________________  Time seen: Approximately 9:33 AM  I have reviewed the triage vital signs and the nursing notes.   HISTORY  Chief Complaint URI and Dizziness   HPI Vicki Rosales is a 59 y.o. female who presents to the emergency department for a 2 week history of nasal congestion and cough. She states that she's had 2 episodes of dizziness over the past week. She has been taking Mucinex for relief of congestion.   Past Medical History  Diagnosis Date  . CVA (cerebral infarction)   . Seizures   . Hyperlipidemia   . Tobacco abuse   . RLS (restless legs syndrome)   . Migraines     ocular  . Cardiomyopathy     EF of 35 %  . TIA (transient ischemic attack)   . Stroke     Patient Active Problem List   Diagnosis Date Noted  . Smoker 08/16/2012  . CVA (cerebral vascular accident) 07/09/2012  . Heart palpitations 07/09/2012  . 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 Seizure 07/09/2012  . Hyperlipidemia 07/09/2012    No past surgical history on file.  Current Outpatient Rx  Name  Route  Sig  Dispense  Refill  . amoxicillin-clavulanate (AUGMENTIN) 875-125 MG per tablet   Oral   Take 1 tablet by mouth 2 (two) times daily.   20 tablet   0   . aspirin 81 MG tablet   Oral   Take 81 mg by mouth daily.         . carvedilol (COREG) 6.25 MG tablet   Oral   Take 1 tablet (6.25 mg total) by mouth 2 (two) times daily.   180 tablet   3   . clopidogrel (PLAVIX) 75 MG tablet      TAKE ONE TABLET BY MOUTH ONCE DAILY   30 tablet   0   . clopidogrel (PLAVIX) 75 MG tablet      TAKE ONE TABLET BY MOUTH ONCE DAILY   30 tablet   3   . lisinopril (PRINIVIL,ZESTRIL) 10 MG tablet   Oral   Take 10 mg by mouth daily.         Marland Kitchen LORazepam (ATIVAN) 0.5 MG tablet   Oral   Take 0.5 mg by mouth every 8 (eight) hours.         . nicotine (NICODERM CQ - DOSED IN  MG/24 HOURS) 14 mg/24hr patch   Transdermal   Place 1 patch onto the skin daily.         Marland Kitchen omeprazole (PRILOSEC) 20 MG capsule   Oral   Take 20 mg by mouth daily.         Marland Kitchen rOPINIRole (REQUIP) 3 MG tablet   Oral   Take 0.5 mg by mouth at bedtime.         . sertraline (ZOLOFT) 100 MG tablet   Oral   Take 100 mg by mouth daily.          . simvastatin (ZOCOR) 40 MG tablet   Oral   Take 40 mg by mouth every evening.           Allergies Review of patient's allergies indicates no known allergies.  Family History  Problem Relation Age of Onset  . Hypertension Mother   . Heart attack Father   . Heart disease Father     CABG  . Heart disease Sister   . Hyperlipidemia Sister   . Hypertension  Sister   . Hypertension Brother   . Hyperlipidemia Brother     Social History History  Substance Use Topics  . Smoking status: Current Every Day Smoker -- 1.00 packs/day for 30 years    Types: Cigarettes  . Smokeless tobacco: Not on file  . Alcohol Use: No    Review of Systems Constitutional: No fever/chills today Eyes: No visual changes. ENT: No sore throat. Cardiovascular: Denies chest pain. Respiratory: Denies shortness of breath. Gastrointestinal: No abdominal pain.  No nausea, no vomiting.  No diarrhea.  No constipation. Genitourinary: Negative for dysuria. Musculoskeletal: Negative for back pain. Skin: Negative for rash. Neurological: Negative for headaches, focal weakness or numbness.  10-point ROS otherwise negative.  ____________________________________________   PHYSICAL EXAM:  VITAL SIGNS: ED Triage Vitals  Enc Vitals Group     BP 10/19/14 0829 139/83 mmHg     Pulse Rate 10/19/14 0829 94     Resp 10/19/14 0829 20     Temp 10/19/14 0829 98.2 F (36.8 C)     Temp Source 10/19/14 0829 Oral     SpO2 10/19/14 0829 99 %     Weight 10/19/14 0843 125 lb (56.7 kg)     Height 10/19/14 0843  (1.651 m)     Head Cir --      Peak Flow --      Pain  Score --      Pain Loc --      Pain Edu? --      Excl. in GC? --     Constitutional: Alert and oriented. Well appearing and in no acute distress. Eyes: Conjunctivae are normal. PERRL. EOMI. Head: Atraumatic. Nose: No congestion/rhinnorhea. Frontal sinus tenderness to percussion Mouth/Throat: Mucous membranes are moist.  Oropharynx non-erythematous. Neck: No stridor.   Cardiovascular: Normal rate, regular rhythm. Grossly normal heart sounds.  Good peripheral circulation. Respiratory: Normal respiratory effort.  No retractions. Lungs CTAB. Gastrointestinal: Soft and nontender. No distention. No abdominal bruits. No CVA tenderness. Musculoskeletal: No lower extremity tenderness nor edema.  No joint effusions. Neurologic:  Normal speech and language. No gross focal neurologic deficits are appreciated. No gait instability. Skin:  Skin is warm, dry and intact. No rash noted. Psychiatric: Mood and affect are normal. Speech and behavior are normal.  ____________________________________________   LABS (all labs ordered are listed, but only abnormal results are displayed)  Labs Reviewed  GLUCOSE, CAPILLARY - Abnormal; Notable for the following:    Glucose-Capillary 107 (*)    All other components within normal limits  CBG MONITORING, ED   ____________________________________________  EKG   Date: 10/19/2014  Rate: 87  Rhythm: normal sinus rhythm  QRS Axis: Left axis deviation  Intervals: normal  ST/T Wave abnormalities: normal  Conduction Disutrbances: none    ____________________________________________  RADIOLOGY  Not indicated ____________________________________________   PROCEDURES  Procedure(s) performed: None  Critical Care performed: No  ____________________________________________   INITIAL IMPRESSION / ASSESSMENT AND PLAN / ED COURSE  Pertinent labs & imaging results that were available during my care of the patient were reviewed by me and considered in  my medical decision making (see chart for details).  Patient was encouraged to take the Augmentin until finished. She was advised follow-up with her primary care provider for symptoms that are not improving over the next 3 days. She was advised to return the emergency department for symptoms that change or worsen if she is unable schedule an appointment. ____________________________________________   FINAL CLINICAL IMPRESSION(S) / ED DIAGNOSES  Final  diagnoses:  Acute frontal sinusitis, recurrence not specified      Chinita Pester, FNP 10/19/14 1610  Gayla Doss, MD 10/19/14 1526

## 2014-10-19 NOTE — Discharge Instructions (Signed)

## 2014-10-25 ENCOUNTER — Other Ambulatory Visit: Payer: Self-pay | Admitting: Family Medicine

## 2014-10-25 NOTE — Telephone Encounter (Signed)
Routing to provider  

## 2014-10-26 ENCOUNTER — Telehealth: Payer: Self-pay

## 2014-10-26 NOTE — Telephone Encounter (Signed)
I spoke with the pharmacy, they state she does not have any refills. She got them filled on: March 4th March 25th April 18th May 12th June 7th June 30.

## 2014-10-26 NOTE — Telephone Encounter (Signed)
Left message for patient to call. Is she taking more than prescribed? She is getting refills early every month.

## 2014-10-26 NOTE — Telephone Encounter (Signed)
Pharm is [facility] on [address]. Pt stated pharmacy stated she did not have any further refills. Please call the pharmacy. Thanks.

## 2014-10-26 NOTE — Telephone Encounter (Signed)
I spoke with the patient, she states she is only taking one a day. She said she had taken 1 and a half pills back at the beginning of the year but she hasn't been lately.

## 2014-10-26 NOTE — Telephone Encounter (Signed)
Last seen June 02, 2014; she's requesting medicine; she should not be out of medicine; [facility] shows 6 month supply approved in March 2016 Please make sure she has appt to get her next refill 6 months from last appointment (on or just before Sept 4, 2016)

## 2014-10-26 NOTE — Telephone Encounter (Signed)
Denied. She needs an appointment. This is not acceptable. Work her in for 15 minute appt tomorrow if she is out of pills.

## 2014-10-26 NOTE — Telephone Encounter (Signed)
Left voicemail on pt phone for her to give [place] a call back and also she should have med refill according to [facility] system and also she needs to schedule a F/U appt by September.

## 2014-10-27 ENCOUNTER — Ambulatory Visit (INDEPENDENT_AMBULATORY_CARE_PROVIDER_SITE_OTHER): Payer: Self-pay | Admitting: Family Medicine

## 2014-10-27 ENCOUNTER — Encounter: Payer: Self-pay | Admitting: Family Medicine

## 2014-10-27 VITALS — BP 118/78 | HR 83 | Temp 97.5°F | Ht 59.5 in | Wt 126.0 lb

## 2014-10-27 DIAGNOSIS — J069 Acute upper respiratory infection, unspecified: Secondary | ICD-10-CM

## 2014-10-27 DIAGNOSIS — I639 Cerebral infarction, unspecified: Secondary | ICD-10-CM | POA: Insufficient documentation

## 2014-10-27 DIAGNOSIS — F419 Anxiety disorder, unspecified: Secondary | ICD-10-CM | POA: Insufficient documentation

## 2014-10-27 DIAGNOSIS — K219 Gastro-esophageal reflux disease without esophagitis: Secondary | ICD-10-CM | POA: Insufficient documentation

## 2014-10-27 DIAGNOSIS — Z87898 Personal history of other specified conditions: Secondary | ICD-10-CM | POA: Insufficient documentation

## 2014-10-27 DIAGNOSIS — G459 Transient cerebral ischemic attack, unspecified: Secondary | ICD-10-CM | POA: Insufficient documentation

## 2014-10-27 DIAGNOSIS — F32A Depression, unspecified: Secondary | ICD-10-CM | POA: Insufficient documentation

## 2014-10-27 DIAGNOSIS — F329 Major depressive disorder, single episode, unspecified: Secondary | ICD-10-CM | POA: Insufficient documentation

## 2014-10-27 DIAGNOSIS — F17210 Nicotine dependence, cigarettes, uncomplicated: Secondary | ICD-10-CM | POA: Insufficient documentation

## 2014-10-27 DIAGNOSIS — J0100 Acute maxillary sinusitis, unspecified: Secondary | ICD-10-CM

## 2014-10-27 DIAGNOSIS — G2581 Restless legs syndrome: Secondary | ICD-10-CM | POA: Insufficient documentation

## 2014-10-27 MED ORDER — SERTRALINE HCL 100 MG PO TABS: 100.0000 mg | ORAL_TABLET | Freq: Every day | ORAL | Status: DC

## 2014-10-27 NOTE — Patient Instructions (Signed)
Please do eat yogurt daily or take a probiotic daily for the next month or two We want to replace the healthy germs in the gut If you notice foul, watery diarrhea in the next two months, schedule an appointment RIGHT AWAY Try vitamin C (orange juice if not diabetic or vitamin C tablets) and drink green tea to help your immune system during your illness Get plenty of rest and hydration Continue your medicine Return for physical whenever needed

## 2014-10-27 NOTE — Progress Notes (Signed)
BP 118/78 mmHg  Pulse 83  Temp(Src) 97.5 F (36.4 C)  Ht 4' 11.5" (1.511 m)  Wt 126 lb (57.153 kg)  BMI 25.03 kg/m2  SpO2 97%   Subjective:    Patient ID: Vicki Rosales, female    DOB: 1955-10-08, 59 y.o.   MRN: [id number]  HPI: [name] is a 59 y.o. female  Chief Complaint  Patient presents with  . Depression    needs a refill on Sertraline  . Other    She will schedule a physical to address health maint. items   She is here today because of filling her antidepressants early; she has been anxious and increased her medicine to 1.5 pills a day; she did look up to make sure on her own that it would be safe to do; she does not want to be at that dose right now; she'd like to get off of it altogether eventuallly It turns out that she says she increased the dose on her own many months ago, but for the last two months, she has been back down to the 100 mg strength; she says she wants to stay now on the 100 mg strength No bad side effects at 150 mg I asked why she might want to come off; she was not able to explain for me to understand She has not had any hypomanic symptoms  She sees a cardiologist, Dr. Mariah Milling and Dr. Sherryll Burger  She is on an antibiotic right now, augmentin from urgent care; tomorrow is last day; for sinus infection  Relevant past medical, surgical, family and social history reviewed and updated as indicated. Interim medical history since our last visit reviewed. Allergies and medications reviewed and updated.  Review of Systems  Per HPI unless specifically indicated above     Objective:    BP 118/78 mmHg  Pulse 83  Temp(Src) 97.5 F (36.4 C)  Ht 4' 11.5" (1.511 m)  Wt 126 lb (57.153 kg)  BMI 25.03 kg/m2  SpO2 97%  Wt Readings from Last 3 Encounters:  10/27/14 126 lb (57.153 kg)  06/02/14 128 lb (58.06 kg)  10/19/14 125 lb (56.7 kg)    Physical Exam  Constitutional: She appears well-developed and well-nourished. No distress.  HENT:   Nose: No rhinorrhea.  Mouth/Throat: Oropharynx is clear and moist. Mucous membranes are not pale and not dry. No oropharyngeal exudate.  Mild mucosal erythema  Eyes: EOM are normal. No scleral icterus.  Neck: No thyromegaly present.  Cardiovascular: Normal rate.   Pulmonary/Chest: Effort normal.  Abdominal: She exhibits no distension.  Skin: No pallor.  Psychiatric: She has a normal mood and affect. Her speech is normal and behavior is normal. Judgment and thought content normal. Her mood appears not anxious. Her affect is not inappropriate. She does not exhibit a depressed mood.    Results for orders placed or performed during the hospital encounter of 10/19/14  Glucose, capillary  Result Value Ref Range   Glucose-Capillary 107 (H) 65 - 99 mg/dL      Assessment & Plan:   Problem List Items Addressed This Visit      Other   Chronic anxiety - Primary    Continue the SSRI; cautioned about adjusting medicine dose on her own and please just always call me so we can adjust dose together if needed;       Relevant Medications   sertraline (ZOLOFT) 100 MG tablet    Other Visit Diagnoses    Acute maxillary sinusitis, recurrence  not specified        C diff precautions    Upper respiratory infection        should resolve on its own; rest, hydration, green tea, vitamin C, etc       Follow up plan: No Follow-up on file.  An after-visit summary was printed and given to the patient at check-out.  Please see the patient instructions which may contain other information and recommendations beyond what is mentioned above in the assessment and plan.

## 2014-10-27 NOTE — Assessment & Plan Note (Signed)
Continue the SSRI; cautioned about adjusting medicine dose on her own and please just always call me so we can adjust dose together if needed;

## 2014-11-06 ENCOUNTER — Ambulatory Visit: Payer: Self-pay | Admitting: Family Medicine

## 2014-11-30 ENCOUNTER — Ambulatory Visit: Payer: Self-pay | Admitting: Family Medicine

## 2015-02-13 ENCOUNTER — Other Ambulatory Visit: Payer: Self-pay | Admitting: Family Medicine

## 2015-02-13 NOTE — Telephone Encounter (Signed)
Routing to provider  

## 2015-02-19 ENCOUNTER — Encounter: Payer: Self-pay | Admitting: Family Medicine

## 2015-05-03 ENCOUNTER — Ambulatory Visit: Payer: Self-pay | Admitting: Family Medicine

## 2015-05-04 ENCOUNTER — Telehealth: Payer: Self-pay | Admitting: Family Medicine

## 2015-05-04 MED ORDER — SERTRALINE HCL 100 MG PO TABS: 100.0000 mg | ORAL_TABLET | Freq: Every day | ORAL | Status: DC

## 2015-05-04 NOTE — Telephone Encounter (Signed)
Routing to provider  

## 2015-05-04 NOTE — Telephone Encounter (Signed)
She no showed yesterday, but I see she has another appt scheduled later this month Rx approved and sent -------------------------- TERESA, Please call patient as you noted below

## 2015-05-04 NOTE — Telephone Encounter (Signed)
Spoke with patient and she is aware that her medication has been refill and ready to pick up, thanks.

## 2015-05-04 NOTE — Telephone Encounter (Signed)
Patient needs refill on her sertraline (ZOLOFT) 100 MG tablet she made appointment for the 17th. Please call patient when this is done. Thanks.

## 2015-05-18 ENCOUNTER — Encounter: Payer: Self-pay | Admitting: Family Medicine

## 2015-05-18 ENCOUNTER — Ambulatory Visit (INDEPENDENT_AMBULATORY_CARE_PROVIDER_SITE_OTHER): Payer: Self-pay | Admitting: Family Medicine

## 2015-05-18 VITALS — BP 162/94 | HR 83 | Temp 98.3°F | Ht 59.0 in | Wt 121.4 lb

## 2015-05-18 DIAGNOSIS — R634 Abnormal weight loss: Secondary | ICD-10-CM | POA: Insufficient documentation

## 2015-05-18 DIAGNOSIS — F419 Anxiety disorder, unspecified: Secondary | ICD-10-CM

## 2015-05-18 DIAGNOSIS — E785 Hyperlipidemia, unspecified: Secondary | ICD-10-CM

## 2015-05-18 DIAGNOSIS — G2581 Restless legs syndrome: Secondary | ICD-10-CM

## 2015-05-18 DIAGNOSIS — Z5181 Encounter for therapeutic drug level monitoring: Secondary | ICD-10-CM | POA: Insufficient documentation

## 2015-05-18 DIAGNOSIS — K219 Gastro-esophageal reflux disease without esophagitis: Secondary | ICD-10-CM

## 2015-05-18 DIAGNOSIS — F17200 Nicotine dependence, unspecified, uncomplicated: Secondary | ICD-10-CM

## 2015-05-18 DIAGNOSIS — I10 Essential (primary) hypertension: Secondary | ICD-10-CM

## 2015-05-18 DIAGNOSIS — Z720 Tobacco use: Secondary | ICD-10-CM

## 2015-05-18 DIAGNOSIS — I63341 Cerebral infarction due to thrombosis of right cerebellar artery: Secondary | ICD-10-CM

## 2015-05-18 MED ORDER — CLOPIDOGREL BISULFATE 75 MG PO TABS: ORAL_TABLET | ORAL | Status: DC

## 2015-05-18 MED ORDER — PANTOPRAZOLE SODIUM 40 MG PO TBEC: 40.0000 mg | DELAYED_RELEASE_TABLET | Freq: Every day | ORAL | Status: DC

## 2015-05-18 MED ORDER — ATENOLOL 25 MG PO TABS: 25.0000 mg | ORAL_TABLET | Freq: Every day | ORAL | Status: DC

## 2015-05-18 MED ORDER — ROPINIROLE HCL 2 MG PO TABS: 2.0000 mg | ORAL_TABLET | Freq: Every day | ORAL | Status: DC

## 2015-05-18 MED ORDER — SERTRALINE HCL 100 MG PO TABS: 100.0000 mg | ORAL_TABLET | Freq: Every day | ORAL | Status: DC

## 2015-05-18 NOTE — Assessment & Plan Note (Signed)
Check ferritin and Mg2+

## 2015-05-18 NOTE — Patient Instructions (Addendum)
Your goal blood pressure is less than 140 mmHg on top and less than 90 on the bottom Try to follow the DASH guidelines (DASH stands for Dietary Approaches to Stop Hypertension) Try to limit the sodium in your diet.  Ideally, consume less than 1.5 grams (less than 1,500mg ) per day. Do not add salt when cooking or at the table.  Check the sodium amount on labels when shopping, and choose items lower in sodium when given a choice. Avoid or limit foods that already contain a lot of sodium. Eat a diet rich in fruits and vegetables and whole grains.  I do encourage you to quit smoking Call [phone] to sign up for smoking cessation classes You can call 1-800-QUIT-NOW to talk with a smoking cessation coach  Try to limit saturated fats in your diet (bologna, hot dogs, barbeque, cheeseburgers, hamburgers, steak, bacon, sausage, cheese, etc.) and get more fresh fruits, vegetables, and whole grains  Try to use PLAIN allergy medicine without the decongestant Avoid: phenylephrine, phenylpropanolamine, and pseudoephredine  I really think that Dr. Sherryll Burger would want you to be taking Plavix. Please call him to clarify that. Please ask him how much aspirin (if any) he wants you to take when you go back on the Plavix. Your current med list has the Plavix (which I think you should be on) as well as the 325 mg aspirin, but I don't think he would want you on both of those. Please do call him for this and schedule a follow-up with him soon.  Stop taking the omeprazole and use pantoprazole instead if you need to be on something for heartburn. Ranitidine (Zantac) would be best and that is available over-the-counter. The ranitidine (Zantac) would not interfere with Plavix.  Caution: prolonged use of proton pump inhibitors like omeprazole (Prilosec), pantoprazole (Protonix), esomeprazole (Nexium), and others like Dexilant and Aciphex may increase your risk of pneumonia, Clostridium difficile colitis, osteoporosis, anemia  and other health complications Try to limit or avoid triggers like coffee, caffeinated beverages, onions, chocolate, spicy foods, peppermint, acid foods like pizza, spaghetti sauce, and orange juice Lose weight if you are overweight or obese Try elevating the head of your bed by placing a small wedge between your mattress and box springs to keep acid in the stomach at night instead of coming up into your esophagus    DASH Eating Plan DASH stands for "Dietary Approaches to Stop Hypertension." The DASH eating plan is a healthy eating plan that has been shown to reduce high blood pressure (hypertension). Additional health benefits may include reducing the risk of type 2 diabetes mellitus, heart disease, and stroke. The DASH eating plan may also help with weight loss. WHAT DO I NEED TO KNOW ABOUT THE DASH EATING PLAN? For the DASH eating plan, you will follow these general guidelines:  Choose foods with a percent daily value for sodium of less than 5% (as listed on the food label).  Use salt-free seasonings or herbs instead of table salt or sea salt.  Check with your health care provider or pharmacist before using salt substitutes.  Eat lower-sodium products, often labeled as "lower sodium" or "no salt added."  Eat fresh foods.  Eat more vegetables, fruits, and low-fat dairy products.  Choose whole grains. Look for the word "whole" as the first word in the ingredient list.  Choose fish and skinless chicken or [place] more often than red meat. Limit fish, poultry, and meat to 6 oz (170 g) each day.  Limit sweets, desserts, sugars,  and sugary drinks.  Choose heart-healthy fats.  Limit cheese to 1 oz (28 g) per day.  Eat more home-cooked food and less restaurant, buffet, and fast food.  Limit fried foods.  Cook foods using methods other than frying.  Limit canned vegetables. If you do use them, rinse them well to decrease the sodium.  When eating at a restaurant, ask that your food  be prepared with less salt, or no salt if possible. WHAT FOODS CAN I EAT? Seek help from a dietitian for individual calorie needs. Grains Whole grain or whole wheat bread. Brown rice. Whole grain or whole wheat pasta. Quinoa, bulgur, and whole grain cereals. Low-sodium cereals. Corn or whole wheat flour tortillas. Whole grain cornbread. Whole grain crackers. Low-sodium crackers. Vegetables Fresh or frozen vegetables (raw, steamed, roasted, or grilled). Low-sodium or reduced-sodium tomato and vegetable juices. Low-sodium or reduced-sodium tomato sauce and paste. Low-sodium or reduced-sodium canned vegetables.  Fruits All fresh, canned (in natural juice), or frozen fruits. Meat and Other Protein Products Ground beef (85% or leaner), grass-fed beef, or beef trimmed of fat. Skinless chicken or [place]. Ground chicken or [place]. Pork trimmed of fat. All fish and seafood. Eggs. Dried beans, peas, or lentils. Unsalted nuts and seeds. Unsalted canned beans. Dairy Low-fat dairy products, such as skim or 1% milk, 2% or reduced-fat cheeses, low-fat ricotta or cottage cheese, or plain low-fat yogurt. Low-sodium or reduced-sodium cheeses. Fats and Oils Tub margarines without trans fats. Light or reduced-fat mayonnaise and salad dressings (reduced sodium). Avocado. Safflower, olive, or canola oils. Natural peanut or almond butter. Other Unsalted popcorn and pretzels. The items listed above may not be a complete list of recommended foods or beverages. Contact your dietitian for more options. WHAT FOODS ARE NOT RECOMMENDED? Grains White bread. White pasta. White rice. Refined cornbread. Bagels and croissants. Crackers that contain trans fat. Vegetables Creamed or fried vegetables. Vegetables in a cheese sauce. Regular canned vegetables. Regular canned tomato sauce and paste. Regular tomato and vegetable juices. Fruits Dried fruits. Canned fruit in light or heavy syrup. Fruit juice. Meat and Other Protein  Products Fatty cuts of meat. Ribs, chicken wings, bacon, sausage, bologna, salami, chitterlings, fatback, hot dogs, bratwurst, and packaged luncheon meats. Salted nuts and seeds. Canned beans with salt. Dairy Whole or 2% milk, cream, half-and-half, and cream cheese. Whole-fat or sweetened yogurt. Full-fat cheeses or blue cheese. Nondairy creamers and whipped toppings. Processed cheese, cheese spreads, or cheese curds. Condiments Onion and garlic salt, seasoned salt, table salt, and sea salt. Canned and packaged gravies. Worcestershire sauce. Tartar sauce. Barbecue sauce. Teriyaki sauce. Soy sauce, including reduced sodium. Steak sauce. Fish sauce. Oyster sauce. Cocktail sauce. Horseradish. Ketchup and mustard. Meat flavorings and tenderizers. Bouillon cubes. Hot sauce. Tabasco sauce. Marinades. Taco seasonings. Relishes. Fats and Oils Butter, stick margarine, lard, shortening, ghee, and bacon fat. Coconut, palm kernel, or palm oils. Regular salad dressings. Other Pickles and olives. Salted popcorn and pretzels. The items listed above may not be a complete list of foods and beverages to avoid. Contact your dietitian for more information. WHERE CAN I FIND MORE INFORMATION? National Heart, Lung, and Blood Institute: [employer]Prom[employer]   This information is not intended to replace advice given to you by your health care provider. Make sure you discuss any questions you have with your health care provider.   Document Released: 03/06/2011 Document Revised: 04/07/2014 Document Reviewed: 01/19/2013 Elsevier Interactive Patient Education 2016 [employer]. Stroke Prevention Some health problems and behaviors may make it more likely for  you to have a stroke. Below are ways to lessen your risk of having a stroke.   Be active for at least 30 minutes on most or all days.  Do not smoke. Try not to be around others who smoke.  Do not drink too much alcohol.  Do not have  more than 2 drinks a day if you are a man.  Do not have more than 1 drink a day if you are a woman and are not pregnant.  Eat healthy foods, such as fruits and vegetables. If you were put on a specific diet, follow the diet as told.  Keep your cholesterol levels under control through diet and medicines. Look for foods that are low in saturated fat, trans fat, cholesterol, and are high in fiber.  If you have diabetes, follow all diet plans and take your medicine as told.  Ask your doctor if you need treatment to lower your blood pressure. If you have high blood pressure (hypertension), follow all diet plans and take your medicine as told by your doctor.  If you are 51-45 years old, have your blood pressure checked every 3-5 years. If you are age 61 or older, have your blood pressure checked every year.  Keep a healthy weight. Eat foods that are low in calories, salt, saturated fat, trans fat, and cholesterol.  Do not take drugs.  Avoid birth control pills, if this applies. Talk to your doctor about the risks of taking birth control pills.  Talk to your doctor if you have sleep problems (sleep apnea).  Take all medicine as told by your doctor.  You may be told to take aspirin or blood thinner medicine. Take this medicine as told by your doctor.  Understand your medicine instructions.  Make sure any other conditions you have are being taken care of. GET HELP RIGHT AWAY IF:  You suddenly lose feeling (you feel numb) or have weakness in your face, arm, or leg.  Your face or eyelid hangs down to one side.  You suddenly feel confused.  You have trouble talking (aphasia) or understanding what people are saying.  You suddenly have trouble seeing in one or both eyes.  You suddenly have trouble walking.  You are dizzy.  You lose your balance or your movements are clumsy (uncoordinated).  You suddenly have a very bad headache and you do not know the cause.  You have new chest  pain.  Your heart feels like it is fluttering or skipping a beat (irregular heartbeat). Do not wait to see if the symptoms above go away. Get help right away. Call your local emergency services (911 in [place] Do not drive yourself to the hospital.   This information is not intended to replace advice given to you by your health care provider. Make sure you discuss any questions you have with your health care provider.   Document Released: 09/16/2011 Document Revised: 04/07/2014 Document Reviewed: 09/17/2012 Elsevier Interactive Patient Education 2016 [employer]. Steps to Quit Smoking  Smoking tobacco can be harmful to your health and can affect almost every organ in your body. Smoking puts you, and those around you, at risk for developing many serious chronic diseases. Quitting smoking is difficult, but it is one of the best things that you can do for your health. It is never too late to quit. WHAT ARE THE BENEFITS OF QUITTING SMOKING? When you quit smoking, you lower your risk of developing serious diseases and conditions, such as:  Lung cancer or  lung disease, such as COPD.  Heart disease.  Stroke.  Heart attack.  Infertility.  Osteoporosis and bone fractures. Additionally, symptoms such as coughing, wheezing, and shortness of breath may get better when you quit. You may also find that you get sick less often because your body is stronger at fighting off colds and infections. If you are pregnant, quitting smoking can help to reduce your chances of having a baby of low birth weight. HOW DO I GET READY TO QUIT? When you decide to quit smoking, create a plan to make sure that you are successful. Before you quit:  Pick a date to quit. Set a date within the next two weeks to give you time to prepare.  Write down the reasons why you are quitting. Keep this list in places where you will see it often, such as on your bathroom mirror or in your car or wallet.  Identify the people, places,  things, and activities that make you want to smoke (triggers) and avoid them. Make sure to take these actions:  Throw away all cigarettes at home, at work, and in your car.  Throw away smoking accessories, such as Set designer.  Clean your car and make sure to empty the ashtray.  Clean your home, including curtains and carpets.  Tell your family, friends, and coworkers that you are quitting. Support from your loved ones can make quitting easier.  Talk with your health care provider about your options for quitting smoking.  Find out what treatment options are covered by your health insurance. WHAT STRATEGIES CAN I USE TO QUIT SMOKING?  Talk with your healthcare provider about different strategies to quit smoking. Some strategies include:  Quitting smoking altogether instead of gradually lessening how much you smoke over a period of time. Research shows that quitting "cold [place]" is more successful than gradually quitting.  Attending in-person counseling to help you build problem-solving skills. You are more likely to have success in quitting if you attend several counseling sessions. Even short sessions of 10 minutes can be effective.  Finding resources and support systems that can help you to quit smoking and remain smoke-free after you quit. These resources are most helpful when you use them often. They can include:  Online chats with a Veterinary surgeon.  Telephone quitlines.  Printed Materials engineer.  Support groups or group counseling.  Text messaging programs.  Mobile phone applications.  Taking medicines to help you quit smoking. (If you are pregnant or breastfeeding, talk with your health care provider first.) Some medicines contain nicotine and some do not. Both types of medicines help with cravings, but the medicines that include nicotine help to relieve withdrawal symptoms. Your health care provider may recommend:  Nicotine patches, gum, or lozenges.  Nicotine  inhalers or sprays.  Non-nicotine medicine that is taken by mouth. Talk with your health care provider about combining strategies, such as taking medicines while you are also receiving in-person counseling. Using these two strategies together makes you more likely to succeed in quitting than if you used either strategy on its own. If you are pregnant or breastfeeding, talk with your health care provider about finding counseling or other support strategies to quit smoking. Do not take medicine to help you quit smoking unless told to do so by your health care provider. WHAT THINGS CAN I DO TO MAKE IT EASIER TO QUIT? Quitting smoking might feel overwhelming at first, but there is a lot that you can do to make it easier. Take  these important actions:  Reach out to your family and friends and ask that they support and encourage you during this time. Call telephone quitlines, reach out to support groups, or work with a counselor for support.  Ask people who smoke to avoid smoking around you.  Avoid places that trigger you to smoke, such as bars, parties, or smoke-break areas at work.  Spend time around people who do not smoke.  Lessen stress in your life, because stress can be a smoking trigger for some people. To lessen stress, try:  Exercising regularly.  Deep-breathing exercises.  Yoga.  Meditating.  Performing a body scan. This involves closing your eyes, scanning your body from head to toe, and noticing which parts of your body are particularly tense. Purposefully relax the muscles in those areas.  Download or purchase mobile phone or tablet apps (applications) that can help you stick to your quit plan by providing reminders, tips, and encouragement. There are many free apps, such as QuitGuide from the [employer] developer for Disease Control and Prevention). You can find other support for quitting smoking (smoking cessation) through smokefree.gov and other websites. HOW WILL I FEEL WHEN I QUIT  SMOKING? Within the first 24 hours of quitting smoking, you may start to feel some withdrawal symptoms. These symptoms are usually most noticeable 2-3 days after quitting, but they usually do not last beyond 2-3 weeks. Changes or symptoms that you might experience include:  Mood swings.  Restlessness, anxiety, or irritation.  Difficulty concentrating.  Dizziness.  Strong cravings for sugary foods in addition to nicotine.  Mild weight gain.  Constipation.  Nausea.  Coughing or a sore throat.  Changes in how your medicines work in your body.  A depressed mood.  Difficulty sleeping (insomnia). After the first 2-3 weeks of quitting, you may start to notice more positive results, such as:  Improved sense of smell and taste.  Decreased coughing and sore throat.  Slower heart rate.  Lower blood pressure.  Clearer skin.  The ability to breathe more easily.  Fewer sick days. Quitting smoking is very challenging for most people. Do not get discouraged if you are not successful the first time. Some people need to make many attempts to quit before they achieve long-term success. Do your best to stick to your quit plan, and talk with your health care provider if you have any questions or concerns.   This information is not intended to replace advice given to you by your health care provider. Make sure you discuss any questions you have with your health care provider.   Document Released: 03/11/2001 Document Revised: 08/01/2014 Document Reviewed: 08/01/2014 Elsevier Interactive Patient Education [employer]

## 2015-05-18 NOTE — Progress Notes (Signed)
BP 162/94 mmHg  Pulse 83  Temp(Src) 98.3 F (36.8 C)  Ht 4\' 11"  (1.499 m)  Wt 121 lb 6.4 oz (55.067 kg)  BMI 24.51 kg/m2  SpO2 99%   Subjective:    Patient ID: Vicki Rosales, female    DOB: [birth year], 60 y.o.   MRN: [id number]  HPI: Vicki Rosales is a 60 y.o. female  Chief Complaint  Patient presents with  . Follow-up  . Medication Management    Patient states no complaints and thinks current medication regiman is working.    Blood pressure is high; she is not eating well, fast foods and not eating as healthy as she could eat; she has been working a lot and thinks it is nothing worry about  Restless legs; on ropinirole; tried to take less and tried to go without out; maybe iron and mag, has been reading  High cholesterol; still eating the saturated fats; fixes something quick; she quit taking the statin about a year ago  She had bronchitis about 6 months ago; had a really good sleep naturally and would like again  Knows she should quit smoking; less than 1 ppd; maybe 1/2 ppd; she is in the process of slowing down on her own  Treating anxiety with sertraline; good dose, stable  Sees Dr. Clelia Croft for her stroke; it has actually been a long time since she saw him, probably a couple of years ago; she quit taking Plavix on her own because she couldn't afford it  Relevant past medical, surgical, family and social history reviewed and updated as indicated. Interim medical history since our last visit reviewed. Allergies and medications reviewed and updated.  Review of Systems Per HPI unless specifically indicated above     Objective:    BP 162/94 mmHg  Pulse 83  Temp(Src) 98.3 F (36.8 C)  Ht 4\' 11"  (1.499 m)  Wt 121 lb 6.4 oz (55.067 kg)  BMI 24.51 kg/m2  SpO2 99%  Wt Readings from Last 3 Encounters:  05/18/15 121 lb 6.4 oz (55.067 kg)  10/27/14 126 lb (57.153 kg)  06/02/14 128 lb (58.06 kg)  BP recheck 166/100 left arm, 162/94 left arm  Today's Vitals   05/18/15 1543 05/18/15 1622  BP: 148/95 162/94  Pulse: 83   Temp: 98.3 F (36.8 C)   Height: 4\' 11"  (1.499 m)   Weight: 121 lb 6.4 oz (55.067 kg)   SpO2: 99%    Physical Exam  Constitutional: She appears well-developed and well-nourished. No distress.  HENT:  Head: Normocephalic and atraumatic.  Eyes: EOM are normal. No scleral icterus.  Neck: No thyromegaly present.  Cardiovascular: Normal rate, regular rhythm and normal heart sounds.   No murmur heard. Pulmonary/Chest: Effort normal and breath sounds normal. No respiratory distress. She has no wheezes.  Abdominal: Soft. Bowel sounds are normal. She exhibits no distension.  Musculoskeletal: Normal range of motion. She exhibits no edema.  Neurological: She is alert. She exhibits normal muscle tone.  Skin: Skin is warm and dry. She is not diaphoretic. No pallor.  Psychiatric: She has a normal mood and affect. Her behavior is normal. Judgment and thought content normal.   Results for orders placed or performed during the hospital encounter of 10/19/14  Glucose, capillary  Result Value Ref Range   Glucose-Capillary 107 (H) 65 - 99 mg/dL      Assessment & Plan:   Problem List Items Addressed This Visit      Cardiovascular and Mediastinum   CVA (  cerebral vascular accident) [facility])    Patient quit taking the statin and the plavix on her own; she says she just can't afford them; I printed off a form for her to apply for patient assistance, gave her a printed hard script of Plavix; advised her to call her neurologist and clarify if does want her back on Plavix (I would think that he would) and if so, how much aspirin should she be on (81 mg, 162 mg, or 325 mg daily); smoking cessation encouraged; see AVS      Relevant Medications   aspirin 325 MG tablet   atenolol (TENORMIN) 25 MG tablet   Essential hypertension, benign    Patient quit taking the beta-blocker on her own; financial reasons; will start generic atenolol as I believe  that is the cheapest once daily beta-blocker; encouraged DASH guidelines, return in 2 weeks for recheck blood pressure and pulse; smoking cessation also encouraged, though she is not ready to quit      Relevant Medications   aspirin 325 MG tablet   atenolol (TENORMIN) 25 MG tablet     Digestive   GERD (gastroesophageal reflux disease)    Limit triggers; discussed risk of PPIs; suspect that she should be back on Plavix, so I am switching from omeprazole to pantoprazole      Relevant Medications   pantoprazole (PROTONIX) 40 MG tablet     Other   Hyperlipidemia - Primary    Check lipids; she quit taking her statin on her own about a year ago; I told her that I will probably be calling her next week to put her back on, and she did not seem happy about that; encouraged her to cut back on saturated fats, see AVS      Relevant Medications   aspirin 325 MG tablet   atenolol (TENORMIN) 25 MG tablet   Other Relevant Orders   Lipid Panel w/o Chol/HDL Ratio   Smoker    Encouraged smoking cessation, see AVS      RLS (restless legs syndrome)    Check ferritin and Mg2+      Relevant Orders   Ferritin   Magnesium   Chronic anxiety    Continue the SSRI      Relevant Medications   sertraline (ZOLOFT) 100 MG tablet   Medication monitoring encounter   Relevant Orders   Ferritin   Magnesium   CBC with Differential/Platelet   Comprehensive metabolic panel   Weight loss    With hypertension; check TSH to r/o hyperthyroidism      Relevant Orders   TSH       Follow up plan: Return in about 2 weeks (around 06/01/2015) for blood pressure and pulse with CMA only.  Meds ordered this encounter  Medications  . aspirin 325 MG tablet    Sig: Take 325 mg by mouth daily.  . clopidogrel (PLAVIX) 75 MG tablet    Sig: TAKE ONE TABLET BY MOUTH ONCE DAILY    Dispense:  90 tablet    Refill:  3  . pantoprazole (PROTONIX) 40 MG tablet    Sig: Take 1 tablet (40 mg total) by mouth daily. Okay  with Plavix; STOP omeprazole    Dispense:  30 tablet    Refill:  2  . atenolol (TENORMIN) 25 MG tablet    Sig: Take 1 tablet (25 mg total) by mouth daily. This replaces carvedilol    Dispense:  30 tablet    Refill:  2  . sertraline (ZOLOFT)  100 MG tablet    Sig: Take 1 tablet (100 mg total) by mouth daily.    Dispense:  30 tablet    Refill:  6  . rOPINIRole (REQUIP) 2 MG tablet    Sig: Take 1 tablet (2 mg total) by mouth at bedtime.    Dispense:  30 tablet    Refill:  6   Orders Placed This Encounter  Procedures  . Ferritin  . Magnesium  . Lipid Panel w/o Chol/HDL Ratio  . CBC with Differential/Platelet  . Comprehensive metabolic panel  . TSH   An after-visit summary was printed and given to the patient at check-out.  Please see the patient instructions which may contain other information and recommendations beyond what is mentioned above in the assessment and plan.

## 2015-05-18 NOTE — Assessment & Plan Note (Signed)
Patient quit taking the beta-blocker on her own; financial reasons; will start generic atenolol as I believe that is the cheapest once daily beta-blocker; encouraged DASH guidelines, return in 2 weeks for recheck blood pressure and pulse; smoking cessation also encouraged, though she is not ready to quit

## 2015-05-18 NOTE — Assessment & Plan Note (Signed)
With hypertension; check TSH to r/o hyperthyroidism

## 2015-05-18 NOTE — Assessment & Plan Note (Signed)
Continue the SSRI

## 2015-05-18 NOTE — Assessment & Plan Note (Addendum)
Limit triggers; discussed risk of PPIs; suspect that she should be back on Plavix, so I am switching from omeprazole to pantoprazole

## 2015-05-18 NOTE — Assessment & Plan Note (Addendum)
Check lipids; she quit taking her statin on her own about a year ago; I told her that I will probably be calling her next week to put her back on, and she did not seem happy about that; encouraged her to cut back on saturated fats, see AVS

## 2015-05-18 NOTE — Assessment & Plan Note (Signed)
Encouraged smoking cessation, see AVS 

## 2015-05-18 NOTE — Assessment & Plan Note (Signed)
Patient quit taking the statin and the plavix on her own; she says she just can't afford them; I printed off a form for her to apply for patient assistance, gave her a printed hard script of Plavix; advised her to call her neurologist and clarify if does want her back on Plavix (I would think that he would) and if so, how much aspirin should she be on (81 mg, 162 mg, or 325 mg daily); smoking cessation encouraged; see AVS

## 2015-05-19 ENCOUNTER — Telehealth: Payer: Self-pay | Admitting: Family Medicine

## 2015-05-19 DIAGNOSIS — E785 Hyperlipidemia, unspecified: Secondary | ICD-10-CM

## 2015-05-19 DIAGNOSIS — Z5181 Encounter for therapeutic drug level monitoring: Secondary | ICD-10-CM

## 2015-05-19 LAB — CBC WITH DIFFERENTIAL/PLATELET
BASOS ABS: 0.1 10*3/uL (ref 0.0–0.2)
BASOS: 1 %
EOS (ABSOLUTE): 0.1 10*3/uL (ref 0.0–0.4)
Eos: 2 %
Hematocrit: 43.6 % (ref 34.0–46.6)
Hemoglobin: 14.4 g/dL (ref 11.1–15.9)
IMMATURE GRANS (ABS): 0 10*3/uL (ref 0.0–0.1)
Immature Granulocytes: 0 %
LYMPHS: 39 %
Lymphocytes Absolute: 2.5 10*3/uL (ref 0.7–3.1)
MCH: 30.1 pg (ref 26.6–33.0)
MCHC: 33 g/dL (ref 31.5–35.7)
MCV: 91 fL (ref 79–97)
Monocytes Absolute: 0.5 10*3/uL (ref 0.1–0.9)
Monocytes: 8 %
NEUTROS ABS: 3.2 10*3/uL (ref 1.4–7.0)
Neutrophils: 50 %
Platelets: 246 10*3/uL (ref 150–379)
RBC: 4.78 x10E6/uL (ref 3.77–5.28)
RDW: 14 % (ref 12.3–15.4)
WBC: 6.3 10*3/uL (ref 3.4–10.8)

## 2015-05-19 LAB — COMPREHENSIVE METABOLIC PANEL
ALT: 8 IU/L (ref 0–32)
AST: 14 IU/L (ref 0–40)
Albumin/Globulin Ratio: 1.5 (ref 1.1–2.5)
Albumin: 4.1 g/dL (ref 3.5–5.5)
Alkaline Phosphatase: 88 IU/L (ref 39–117)
BILIRUBIN TOTAL: 0.3 mg/dL (ref 0.0–1.2)
BUN/Creatinine Ratio: 13 (ref 9–23)
BUN: 12 mg/dL (ref 6–24)
CHLORIDE: 102 mmol/L (ref 96–106)
CO2: 26 mmol/L (ref 18–29)
Calcium: 9.7 mg/dL (ref 8.7–10.2)
Creatinine, Ser: 0.92 mg/dL (ref 0.57–1.00)
GFR calc non Af Amer: 69 mL/min/{1.73_m2} (ref >59)
GFR, EST AFRICAN AMERICAN: 79 mL/min/{1.73_m2} (ref >59)
GLUCOSE: 81 mg/dL (ref 65–99)
Globulin, Total: 2.7 g/dL (ref 1.5–4.5)
Potassium: 4.2 mmol/L (ref 3.5–5.2)
Sodium: 142 mmol/L (ref 134–144)
TOTAL PROTEIN: 6.8 g/dL (ref 6.0–8.5)

## 2015-05-19 LAB — MAGNESIUM: MAGNESIUM: 2 mg/dL (ref 1.6–2.3)

## 2015-05-19 LAB — LIPID PANEL W/O CHOL/HDL RATIO
CHOLESTEROL TOTAL: 263 mg/dL — AB (ref 100–199)
HDL: 49 mg/dL (ref >39)
LDL CALC: 162 mg/dL — AB (ref 0–99)
Triglycerides: 260 mg/dL — ABNORMAL HIGH (ref 0–149)
VLDL Cholesterol Cal: 52 mg/dL — ABNORMAL HIGH (ref 5–40)

## 2015-05-19 LAB — TSH: TSH: 0.822 u[IU]/mL (ref 0.450–4.500)

## 2015-05-19 LAB — FERRITIN: Ferritin: 31 ng/mL (ref 15–150)

## 2015-05-19 NOTE — Telephone Encounter (Signed)
I left msg, calling about labs; do not be alarmed I'm calling on a Saturday, just catching up -------------------- Mg2+ normal, ferritin normal, CBC normal, CMP normal, TSH normal Cholesterol panel needs work (high total, TG, and LDL)

## 2015-05-21 MED ORDER — PRAVASTATIN SODIUM 40 MG PO TABS: 40.0000 mg | ORAL_TABLET | Freq: Every day | ORAL | Status: DC

## 2015-05-21 NOTE — Telephone Encounter (Signed)
I spoke with patient about lab results; explained unfavorable chol panel; start back on statin; recheck labs in 8 weeks; orders entered; stop med and call if any pain, symptoms

## 2015-05-21 NOTE — Assessment & Plan Note (Signed)
Check sgpt in 8 weeks 

## 2015-05-21 NOTE — Assessment & Plan Note (Signed)
Start back on statin; check lipids and sgpt in 8 weeks

## 2015-09-04 ENCOUNTER — Other Ambulatory Visit: Payer: Self-pay | Admitting: Family Medicine

## 2015-09-21 ENCOUNTER — Ambulatory Visit: Payer: Self-pay | Admitting: Unknown Physician Specialty

## 2015-12-25 ENCOUNTER — Telehealth: Payer: Self-pay | Admitting: Unknown Physician Specialty

## 2015-12-25 ENCOUNTER — Other Ambulatory Visit: Payer: Self-pay | Admitting: Family Medicine

## 2015-12-25 MED ORDER — SERTRALINE HCL 100 MG PO TABS: 100.0000 mg | ORAL_TABLET | Freq: Every day | ORAL | 1 refills | Status: DC

## 2015-12-25 NOTE — Telephone Encounter (Signed)
Can we send this patient enough medication just to get to her appointment?

## 2015-12-25 NOTE — Telephone Encounter (Signed)
Routing to provider  

## 2015-12-25 NOTE — Telephone Encounter (Signed)
Refused as I have never seen her before

## 2015-12-25 NOTE — Telephone Encounter (Signed)
Patient has appointment to [facility]. She is needing a refill on Sertraline but they told her to contact you to see if you would give her enough to last until her appointment since you saw her last. If so please send to [facility]. Patient took last pill today

## 2015-12-25 NOTE — Telephone Encounter (Signed)
Pt called stated she has an appointment scheduled with Vicki Rosales for 01/09/16 wants to know if enough medication can be called in to last until her appt date. [facility] [address]. Please call pt to follow up. Thanks.

## 2015-12-25 NOTE — Telephone Encounter (Signed)
Pt needs a refill on sertraline (ZOLOFT) 100 MG tablet sent to [facility] [address] to last until appt 01/09/16.

## 2016-01-09 ENCOUNTER — Ambulatory Visit: Payer: Self-pay | Admitting: Unknown Physician Specialty

## 2016-01-15 ENCOUNTER — Encounter: Payer: Self-pay | Admitting: Unknown Physician Specialty

## 2016-01-15 ENCOUNTER — Ambulatory Visit (INDEPENDENT_AMBULATORY_CARE_PROVIDER_SITE_OTHER): Payer: Self-pay | Admitting: Unknown Physician Specialty

## 2016-01-15 DIAGNOSIS — G2581 Restless legs syndrome: Secondary | ICD-10-CM

## 2016-01-15 DIAGNOSIS — F339 Major depressive disorder, recurrent, unspecified: Secondary | ICD-10-CM

## 2016-01-15 DIAGNOSIS — E7800 Pure hypercholesterolemia, unspecified: Secondary | ICD-10-CM

## 2016-01-15 DIAGNOSIS — I10 Essential (primary) hypertension: Secondary | ICD-10-CM

## 2016-01-15 MED ORDER — ROPINIROLE HCL 2 MG PO TABS: 2.0000 mg | ORAL_TABLET | Freq: Every day | ORAL | 6 refills | Status: DC

## 2016-01-15 MED ORDER — PRAVASTATIN SODIUM 40 MG PO TABS: 40.0000 mg | ORAL_TABLET | Freq: Every day | ORAL | 6 refills | Status: DC

## 2016-01-15 MED ORDER — SERTRALINE HCL 100 MG PO TABS: 100.0000 mg | ORAL_TABLET | Freq: Every day | ORAL | 6 refills | Status: DC

## 2016-01-15 NOTE — Progress Notes (Signed)
BP 139/72 (BP Location: Left Arm, Cuff Size: Normal)   Pulse 79   Temp 97.6 F (36.4 C)   Ht 4' 11.7" (1.516 m)   Wt 128 lb 9.6 oz (58.3 kg)   SpO2 98%   BMI 25.37 kg/m    Subjective:    Patient ID: Vicki Rosales, female    DOB: [birth year], 60 y.o.   MRN: [id number]  HPI: Vicki Rosales is a 60 y.o. female  Chief Complaint  Patient presents with  . Hyperlipidemia  . Hypertension    pt states she has been out of atenolol for a few months   . Gastroesophageal Reflux  . Depression   Hypertension Not taking as ran out Average home BPs   No problems or lightheadedness No chest pain with exertion or shortness of breath No Edema   Hyperlipidemia Not taking as ran out No Muscle aches  Diet compliance:"not enough" Exercise:" not enough"  Restless legs Doing well with this  Depression  Doing well Depression screen [facility] 2/9 01/15/2016 10/27/2014  Decreased Interest 0 0  Down, Depressed, Hopeless 0 0  PHQ - 2 Score 0 0  Altered sleeping 0 -  Tired, decreased energy 0 -  Change in appetite 0 -  Feeling bad or failure about yourself  0 -  Trouble concentrating 0 -  Moving slowly or fidgety/restless 0 -  Suicidal thoughts 0 -  PHQ-9 Score 0 -      Relevant past medical, surgical, family and social history reviewed and updated as indicated. Interim medical history since our last visit reviewed. Allergies and medications reviewed and updated.  Review of Systems  Per HPI unless specifically indicated above     Objective:    BP 139/72 (BP Location: Left Arm, Cuff Size: Normal)   Pulse 79   Temp 97.6 F (36.4 C)   Ht 4' 11.7" (1.516 m)   Wt 128 lb 9.6 oz (58.3 kg)   SpO2 98%   BMI 25.37 kg/m   Wt Readings from Last 3 Encounters:  01/15/16 128 lb 9.6 oz (58.3 kg)  05/18/15 121 lb 6.4 oz (55.1 kg)  10/27/14 126 lb (57.2 kg)    Physical Exam  Constitutional: She is oriented to person, place, and time. She appears well-developed and  well-nourished. No distress.  HENT:  Head: Normocephalic and atraumatic.  Eyes: Conjunctivae and lids are normal. Right eye exhibits no discharge. Left eye exhibits no discharge. No scleral icterus.  Neck: Normal range of motion. Neck supple. No JVD present. Carotid bruit is not present.  Cardiovascular: Normal rate, regular rhythm and normal heart sounds.   Pulmonary/Chest: Effort normal and breath sounds normal.  Abdominal: Normal appearance. There is no splenomegaly or hepatomegaly.  Musculoskeletal: Normal range of motion.  Neurological: She is alert and oriented to person, place, and time.  Skin: Skin is warm, dry and intact. No rash noted. No pallor.  Psychiatric: She has a normal mood and affect. Her behavior is normal. Judgment and thought content normal.    Results for orders placed or performed in visit on 05/18/15  Ferritin  Result Value Ref Range   Ferritin 31 15 - 150 ng/mL  Magnesium  Result Value Ref Range   Magnesium 2.0 1.6 - 2.3 mg/dL  Lipid Panel w/o Chol/HDL Ratio  Result Value Ref Range   Cholesterol, Total 263 (H) 100 - 199 mg/dL   Triglycerides 469 (H) 0 - 149 mg/dL   HDL 49 >62 mg/dL   VLDL Cholesterol  Cal 52 (H) 5 - 40 mg/dL   LDL Calculated 098162 (H) 0 - 99 mg/dL  CBC with Differential/Platelet  Result Value Ref Range   WBC 6.3 3.4 - 10.8 x10E3/uL   RBC 4.78 3.77 - 5.28 x10E6/uL   Hemoglobin 14.4 11.1 - 15.9 g/dL   Hematocrit 11.943.6 14.734.0 - 46.6 %   MCV 91 79 - 97 fL   MCH 30.1 26.6 - 33.0 pg   MCHC 33.0 31.5 - 35.7 g/dL   RDW 82.914.0 56.212.3 - 13.015.4 %   Platelets 246 150 - 379 x10E3/uL   Neutrophils 50 %   Lymphs 39 %   Monocytes 8 %   Eos 2 %   Basos 1 %   Neutrophils Absolute 3.2 1.4 - 7.0 x10E3/uL   Lymphocytes Absolute 2.5 0.7 - 3.1 x10E3/uL   Monocytes Absolute 0.5 0.1 - 0.9 x10E3/uL   EOS (ABSOLUTE) 0.1 0.0 - 0.4 x10E3/uL   Basophils Absolute 0.1 0.0 - 0.2 x10E3/uL   Immature Granulocytes 0 %   Immature Grans (Abs) 0.0 0.0 - 0.1 x10E3/uL    Comprehensive metabolic panel  Result Value Ref Range   Glucose 81 65 - 99 mg/dL   BUN 12 6 - 24 mg/dL   Creatinine, Ser 8.650.92 0.57 - 1.00 mg/dL   GFR calc non Af Amer 69 >59 mL/min/1.73   GFR calc Af Amer 79 >59 mL/min/1.73   BUN/Creatinine Ratio 13 9 - 23   Sodium 142 134 - 144 mmol/L   Potassium 4.2 3.5 - 5.2 mmol/L   Chloride 102 96 - 106 mmol/L   CO2 26 18 - 29 mmol/L   Calcium 9.7 8.7 - 10.2 mg/dL   Total Protein 6.8 6.0 - 8.5 g/dL   Albumin 4.1 3.5 - 5.5 g/dL   Globulin, Total 2.7 1.5 - 4.5 g/dL   Albumin/Globulin Ratio 1.5 1.1 - 2.5   Bilirubin Total 0.3 0.0 - 1.2 mg/dL   Alkaline Phosphatase 88 39 - 117 IU/L   AST 14 0 - 40 IU/L   ALT 8 0 - 32 IU/L  TSH  Result Value Ref Range   TSH 0.822 0.450 - 4.500 uIU/mL      Assessment & Plan:   Problem List Items Addressed This Visit      Unprioritized   Depression    Stable, continue present medications.        Relevant Medications   sertraline (ZOLOFT) 100 MG tablet   Essential hypertension, benign    DC Atenolol.  Will DC      Relevant Medications   pravastatin (PRAVACHOL) 40 MG tablet   Hyperlipidemia    Restart      Relevant Medications   pravastatin (PRAVACHOL) 40 MG tablet   RLS (restless legs syndrome)    Stable, continue present medications.         Other Visit Diagnoses   None.      Follow up plan: Return in about 3 months (around 04/16/2016).

## 2016-01-15 NOTE — Assessment & Plan Note (Signed)
Stable, continue present medications.   

## 2016-01-15 NOTE — Assessment & Plan Note (Signed)
Restart

## 2016-01-15 NOTE — Assessment & Plan Note (Signed)
DC Atenolol.  Will DC

## 2016-09-12 ENCOUNTER — Other Ambulatory Visit: Payer: Self-pay | Admitting: Unknown Physician Specialty

## 2016-09-12 MED ORDER — SERTRALINE HCL 100 MG PO TABS: 100.0000 mg | ORAL_TABLET | Freq: Every day | ORAL | 6 refills | Status: DC

## 2016-09-12 NOTE — Telephone Encounter (Signed)
Routing to provider, next appt on 09/22/16.

## 2016-09-12 NOTE — Telephone Encounter (Signed)
Pt stated she is to take her last pill tomorrow and would like to request a refill for sertraline (ZOLOFT) 100 MG tablet. Pt has an appt scheduled for 09/22/16. Pharmacy is [facility] [address].

## 2016-09-22 ENCOUNTER — Ambulatory Visit: Payer: Self-pay | Admitting: Unknown Physician Specialty

## 2017-01-01 ENCOUNTER — Emergency Department
Admission: EM | Admit: 2017-01-01 | Discharge: 2017-01-02 | Disposition: A | Discharge status: Home / Self Care | Payer: Self-pay | Attending: Emergency Medicine | Admitting: Emergency Medicine

## 2017-01-01 DIAGNOSIS — Z7902 Long term (current) use of antithrombotics/antiplatelets: Secondary | ICD-10-CM | POA: Insufficient documentation

## 2017-01-01 DIAGNOSIS — F17210 Nicotine dependence, cigarettes, uncomplicated: Secondary | ICD-10-CM | POA: Insufficient documentation

## 2017-01-01 DIAGNOSIS — Z79899 Other long term (current) drug therapy: Secondary | ICD-10-CM | POA: Insufficient documentation

## 2017-01-01 DIAGNOSIS — R1013 Epigastric pain: Secondary | ICD-10-CM | POA: Insufficient documentation

## 2017-01-01 DIAGNOSIS — I10 Essential (primary) hypertension: Secondary | ICD-10-CM | POA: Insufficient documentation

## 2017-01-01 DIAGNOSIS — R112 Nausea with vomiting, unspecified: Secondary | ICD-10-CM | POA: Insufficient documentation

## 2017-01-01 DIAGNOSIS — Z7982 Long term (current) use of aspirin: Secondary | ICD-10-CM | POA: Insufficient documentation

## 2017-01-01 DIAGNOSIS — Z8673 Personal history of transient ischemic attack (TIA), and cerebral infarction without residual deficits: Secondary | ICD-10-CM | POA: Insufficient documentation

## 2017-01-01 LAB — COMPREHENSIVE METABOLIC PANEL
ALBUMIN: 4.4 g/dL (ref 3.5–5.0)
ALT: 12 U/L — ABNORMAL LOW (ref 14–54)
ANION GAP: 13 (ref 5–15)
AST: 23 U/L (ref 15–41)
Alkaline Phosphatase: 93 U/L (ref 38–126)
BUN: 29 mg/dL — ABNORMAL HIGH (ref 6–20)
CO2: 25 mmol/L (ref 22–32)
Calcium: 9.8 mg/dL (ref 8.9–10.3)
Chloride: 97 mmol/L — ABNORMAL LOW (ref 101–111)
Creatinine, Ser: 1.23 mg/dL — ABNORMAL HIGH (ref 0.44–1.00)
GFR calc Af Amer: 54 mL/min — ABNORMAL LOW (ref >60)
GFR calc non Af Amer: 47 mL/min — ABNORMAL LOW (ref >60)
GLUCOSE: 101 mg/dL — AB (ref 65–99)
POTASSIUM: 3.7 mmol/L (ref 3.5–5.1)
SODIUM: 135 mmol/L (ref 135–145)
Total Bilirubin: 1.2 mg/dL (ref 0.3–1.2)
Total Protein: 8.4 g/dL — ABNORMAL HIGH (ref 6.5–8.1)

## 2017-01-01 LAB — CBC
HCT: 48.1 % — ABNORMAL HIGH (ref 35.0–47.0)
HEMOGLOBIN: 16.1 g/dL — AB (ref 12.0–16.0)
MCH: 29.7 pg (ref 26.0–34.0)
MCHC: 33.5 g/dL (ref 32.0–36.0)
MCV: 88.6 fL (ref 80.0–100.0)
Platelets: 223 10*3/uL (ref 150–440)
RBC: 5.43 MIL/uL — ABNORMAL HIGH (ref 3.80–5.20)
RDW: 14.5 % (ref 11.5–14.5)
WBC: 12 10*3/uL — ABNORMAL HIGH (ref 3.6–11.0)

## 2017-01-01 LAB — TROPONIN I: Troponin I: <0.03 ng/mL (ref <0.03)

## 2017-01-01 LAB — LIPASE, BLOOD: Lipase: 20 U/L (ref 11–51)

## 2017-01-01 MED ORDER — SODIUM CHLORIDE 0.9 % IV BOLUS (SEPSIS)
1000.0000 mL | Freq: Once | INTRAVENOUS | Status: AC
  Administered 2017-01-01: 1000 mL via INTRAVENOUS

## 2017-01-01 MED ORDER — GI COCKTAIL ~~LOC~~
30.0000 mL | Freq: Once | ORAL | Status: AC
  Administered 2017-01-01: 30 mL via ORAL
  Filled 2017-01-01: qty 30

## 2017-01-01 NOTE — ED Triage Notes (Signed)
Pt in with co vomiting x 2 days, no diarrhea. Pt co mid abd pain radiating up to chest, describes it as burning.

## 2017-01-01 NOTE — ED Provider Notes (Signed)
[facility] Emergency Department Provider Note  Time seen: 11:51 PM  I have reviewed the triage vital signs and the nursing notes.   HISTORY  Chief Complaint Emesis    HPI [name] is a 61 y.o. female with a past medical history of anxiety, CHF, CVA, gastric reflux, hyperlipidemia, restless leg syndrome, seizure disorder, presents to the emergency department for upper abdominal discomfort and nausea and vomiting. According to the patient for the past 2 days she has been nauseated with frequent episodes of vomiting. She states a burning sensation in her upper abdomen into her chest. Denies any worsening with eating although states she has not been attempting to eat much due to the nausea. Denies any dysuria or diarrhea. Denies black or bloody stool or vomit. Denies fever. Describes her discomfort as mild, nausea as moderate.  Past Medical History:  Diagnosis Date  . Anxiety   . Cardiomyopathy (HCC)    EF of 35 %  . CVA (cerebral infarction) 2007 and 2008   followed by Dr, Sherryll Burger  . Depression   . GERD (gastroesophageal reflux disease)   . History of seizure    after her stroke  . Hyperlipidemia   . Migraines    ocular  . RLS (restless legs syndrome)   . Seizures (HCC)   . Stroke (HCC)   . TIA (transient ischemic attack) 2014   followed by Dr. Sherryll Burger  . Tobacco abuse     Patient Active Problem List   Diagnosis Date Noted  . Medication monitoring encounter 05/18/2015  . Weight loss 05/18/2015  . Essential hypertension, benign 05/18/2015  . Tobacco abuse   . RLS (restless legs syndrome)   . TIA (transient ischemic attack)   . Depression   . Chronic anxiety   . GERD (gastroesophageal reflux disease)   . History of seizure   . Smoker 08/16/2012  . CVA (cerebral vascular accident) (HCC) 07/09/2012  . Heart palpitations 07/09/2012  . 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 Seizure (HCC) 07/09/2012  . Hyperlipidemia 07/09/2012     Past Surgical History:  Procedure Laterality Date  . CESAREAN SECTION      Prior to Admission medications   Medication Sig Start Date End Date Taking? Authorizing Provider  aspirin 325 MG tablet Take 325 mg by mouth daily.    [provider]  clopidogrel (PLAVIX) 75 MG tablet TAKE ONE TABLET BY MOUTH ONCE DAILY 05/18/15   Lada, Janit Bern, MD  pravastatin (PRAVACHOL) 40 MG tablet Take 1 tablet (40 mg total) by mouth at bedtime. (have labs done 4-5 days before you run out of 2nd bottle in April) 01/15/16   Gabriel Cirri, NP  ranitidine (ZANTAC) 150 MG tablet Take 150 mg by mouth at bedtime.    [provider]  rOPINIRole (REQUIP) 2 MG tablet Take 1 tablet (2 mg total) by mouth at bedtime. 01/15/16   Gabriel Cirri, NP  sertraline (ZOLOFT) 100 MG tablet Take 1 tablet (100 mg total) by mouth daily. 09/12/16   Gabriel Cirri, NP    No Known Allergies  Family History  Problem Relation Age of Onset  . Hypertension Mother   . Heart attack Father   . Heart disease Father        CABG  . Heart disease Sister   . Hyperlipidemia Sister   . Hypertension Sister   . Asthma Sister   . Diabetes Sister   . COPD Sister   . Hypertension Brother   . Hyperlipidemia  Brother   . Heart disease Maternal Grandmother        CHF  . Diabetes Maternal Grandfather     Social History Social History  Substance Use Topics  . Smoking status: Current Every Day Smoker    Packs/day: 0.50    Years: 30.00    Types: Cigarettes  . Smokeless tobacco: Never Used  . Alcohol use No    Review of Systems Constitutional: Negative for fever Cardiovascular: Negative for chest pain. Respiratory: Negative for shortness of breath. Gastrointestinal: upper abdominal burning.  Nausea and vomiting Genitourinary: Negative for dysuria. Neurological: Negative for headache All other ROS negative  ____________________________________________   PHYSICAL EXAM:  VITAL SIGNS: ED Triage Vitals  Enc  Vitals Group     BP 01/01/17 1951 (!) 107/93     Pulse Rate 01/01/17 1951 (!) 104     Resp 01/01/17 1951 20     Temp 01/01/17 1951 97.8 F (36.6 C)     Temp Source 01/01/17 1951 Oral     SpO2 01/01/17 1951 96 %     Weight 01/01/17 1952 130 lb (59 kg)     Height 01/01/17 1952  (1.549 m)     Head Circumference --      Peak Flow --      Pain Score 01/01/17 1951 0     Pain Loc --      Pain Edu? --      Excl. in GC? --     Constitutional: Alert and oriented. Well appearing and in no distress. Eyes: Normal exam ENT   Head: Normocephalic and atraumatic.   Mouth/Throat: Somewhat dry mucous membranes  Cardiovascular: Normal rate, regular rhythm. No murmur Respiratory: Normal respiratory effort without tachypnea nor retractions. Breath sounds are clear Gastrointestinal: Soft and nontender. No distention.   Musculoskeletal: Nontender with normal range of motion in all extremities.  Neurologic:  Normal speech and language. No gross focal neurologic deficits  Skin:  Skin is warm, dry and intact.  Psychiatric: Mood and affect are normal. Speech and behavior are normal.   ____________________________________________    EKG  EKG reviewed and interpreted by myself shows normal sinus rhythm at 97 bpm, narrow QRS, left axis deviation, normal intervals, nonspecific ST changes without ST elevation.  ____________________________________________  INITIAL IMPRESSION / ASSESSMENT AND PLAN / ED COURSE  Pertinent labs & imaging results that were available during my care of the patient were reviewed by me and considered in my medical decision making (see chart for details).  The patient presents to the emergency department 2 days of nausea and vomiting with upper abdominal burning discomfort radiating into her chest. Differential this time would include pancreatitis, gallbladder disease, gastric reflux, gastritis/esophagitis, ACS. Patient's labs are largely at baseline with a very slight  leukocytosis. Lipase is normal. LFTs are normal, very slight renal insufficiency. Troponin is negative. EKG is reassuring. We will treat with a GI cocktail, IV hydrate and continue to closely monitor. Currently the patient states she feels well denies any current nausea.  Reviewed old records and labs.  patient states she is feeling much better than when she came in. Patient's urinalysis is normal. Troponin negative. Patient has received 2 L of fluid. We'll discharge home with Zofran. I discussed return precautions for any increased abdominal discomfort or vomiting or any fever. Patient agreeable to plan.  ____________________________________________   FINAL CLINICAL IMPRESSION(S) / ED DIAGNOSES  Nausea and vomiting Abdominal pain/burning    Minna Antis, MD 01/02/17 [phone]

## 2017-01-02 LAB — URINALYSIS, COMPLETE (UACMP) WITH MICROSCOPIC
BACTERIA UA: NONE SEEN
BILIRUBIN URINE: NEGATIVE
GLUCOSE, UA: NEGATIVE mg/dL
Ketones, ur: 20 mg/dL — AB
NITRITE: NEGATIVE
PROTEIN: NEGATIVE mg/dL
Specific Gravity, Urine: 1.012 (ref 1.005–1.030)
Squamous Epithelial / LPF: NONE SEEN
pH: 6 (ref 5.0–8.0)

## 2017-01-02 MED ORDER — ACETAMINOPHEN 500 MG PO TABS
1000.0000 mg | ORAL_TABLET | Freq: Once | ORAL | Status: AC
  Administered 2017-01-02: 1000 mg via ORAL
  Filled 2017-01-02: qty 2

## 2017-01-02 MED ORDER — ONDANSETRON 4 MG PO TBDP: 4.0000 mg | ORAL_TABLET | Freq: Three times a day (TID) | ORAL | 0 refills | Status: DC | PRN

## 2017-01-02 NOTE — ED Notes (Signed)

## 2017-01-02 NOTE — ED Notes (Signed)
Pt requesting tylenol prior to departure. MD to be consulted when not in another pt room

## 2017-01-02 NOTE — ED Notes (Signed)
Pt states feeling better  Urine specimen sent with chart label per lab approval d/t printer issues in room. IT request submitted.

## 2017-01-02 NOTE — ED Notes (Signed)
Pt up to toilet in room again, no complications

## 2017-02-04 ENCOUNTER — Other Ambulatory Visit: Payer: Self-pay | Admitting: Unknown Physician Specialty

## 2017-02-04 ENCOUNTER — Telehealth: Payer: Self-pay | Admitting: Unknown Physician Specialty

## 2017-02-04 NOTE — Telephone Encounter (Signed)
Copied from CRM [phone]. Topic: General - Other >> Feb 04, 2017 10:05 AM Cecelia ByarsGreen, Temeka L, RMA wrote: Reason for CRM: patient is requesting a refill on Ropinirole 2 mg to please be sent to [facility] [address] please call pt once refill has been sent at [phone], pt is completely out of medication

## 2017-02-04 NOTE — Telephone Encounter (Signed)
Last appointment 1 year ago- no future appointments scheduled

## 2017-02-04 NOTE — Telephone Encounter (Signed)
Refill request

## 2017-02-06 MED ORDER — ROPINIROLE HCL 2 MG PO TABS: 2.0000 mg | ORAL_TABLET | Freq: Every day | ORAL | 3 refills | Status: DC

## 2017-02-23 ENCOUNTER — Ambulatory Visit: Payer: Self-pay | Admitting: Unknown Physician Specialty

## 2017-04-14 ENCOUNTER — Encounter: Payer: Self-pay | Admitting: Unknown Physician Specialty

## 2017-04-14 ENCOUNTER — Ambulatory Visit (INDEPENDENT_AMBULATORY_CARE_PROVIDER_SITE_OTHER): Payer: Self-pay | Admitting: Unknown Physician Specialty

## 2017-04-14 ENCOUNTER — Other Ambulatory Visit: Payer: Self-pay | Admitting: Unknown Physician Specialty

## 2017-04-14 DIAGNOSIS — F411 Generalized anxiety disorder: Secondary | ICD-10-CM | POA: Insufficient documentation

## 2017-04-14 DIAGNOSIS — E7800 Pure hypercholesterolemia, unspecified: Secondary | ICD-10-CM

## 2017-04-14 DIAGNOSIS — F419 Anxiety disorder, unspecified: Secondary | ICD-10-CM

## 2017-04-14 DIAGNOSIS — I10 Essential (primary) hypertension: Secondary | ICD-10-CM

## 2017-04-14 MED ORDER — SERTRALINE HCL 100 MG PO TABS: 100.0000 mg | ORAL_TABLET | Freq: Every day | ORAL | 5 refills | Status: DC

## 2017-04-14 MED ORDER — BUSPIRONE HCL 5 MG PO TABS: 5.0000 mg | ORAL_TABLET | Freq: Three times a day (TID) | ORAL | 1 refills | Status: DC

## 2017-04-14 MED ORDER — PRAVASTATIN SODIUM 40 MG PO TABS: 40.0000 mg | ORAL_TABLET | Freq: Every day | ORAL | 6 refills | Status: DC

## 2017-04-14 NOTE — Assessment & Plan Note (Signed)
Pt states she is willing to start Pravastatin

## 2017-04-14 NOTE — Assessment & Plan Note (Signed)
Borderline high off of medications.  Will continue to monitor every 6 months

## 2017-04-14 NOTE — Assessment & Plan Note (Addendum)
Start Busiprion TID for anxiety symptoms.  Pt ed on esxercise and setting aside time for herself

## 2017-04-14 NOTE — Progress Notes (Signed)
BP (!) 143/83   Pulse 82   Temp 98.2 F (36.8 C) (Oral)   Ht 5' 0.6" (1.539 m)   Wt 139 lb 8 oz (63.3 kg)   SpO2 98%   BMI 26.71 kg/m    Subjective:    Patient ID: Vicki Rosales, female    DOB: [birth year], 62 y.o.   MRN: [id number]  HPI: Vicki Rosales is a 61 y.o. female  Chief Complaint  Patient presents with  . Depression  . Hypertension  . RLS   Hypertension Currently not taking BP medications Average home BPs Not checking  No problems or lightheadedness No chest pain with exertion or shortness of breath No Edema   Hyperlipidemia Did not get her medications as she was concerned with cost though doesn't know what the cost is.   Diet compliance:Exercise:Gained some weight.  Little exercise.     Depression Stable on current medications.  States she is going through a lot with caring for grand children, working on deadlines, has 2 elderly parents, she is divorced and having trouble with communicating.  She would like something to help her overwhelmed feeling.   Depression screen [facility] 2/9 04/14/2017 01/15/2016 10/27/2014  Decreased Interest 0 0 0  Down, Depressed, Hopeless 0 0 0  PHQ - 2 Score 0 0 0  Altered sleeping 0 0 -  Tired, decreased energy 0 0 -  Change in appetite 0 0 -  Feeling bad or failure about yourself  0 0 -  Trouble concentrating 0 0 -  Moving slowly or fidgety/restless 0 0 -  Suicidal thoughts 0 0 -  PHQ-9 Score 0 0 -     Relevant past medical, surgical, family and social history reviewed and updated as indicated. Interim medical history since our last visit reviewed. Allergies and medications reviewed and updated.  Review of Systems  Per HPI unless specifically indicated above     Objective:    BP (!) 143/83   Pulse 82   Temp 98.2 F (36.8 C) (Oral)   Ht 5' 0.6" (1.539 m)   Wt 139 lb 8 oz (63.3 kg)   SpO2 98%   BMI 26.71 kg/m   Wt Readings from Last 3 Encounters:  04/14/17 139 lb 8 oz (63.3 kg)  01/01/17 130 lb (59  kg)  01/15/16 128 lb 9.6 oz (58.3 kg)    Physical Exam  Constitutional: She is oriented to person, place, and time. She appears well-developed and well-nourished. No distress.  HENT:  Head: Normocephalic and atraumatic.  Eyes: Conjunctivae and lids are normal. Right eye exhibits no discharge. Left eye exhibits no discharge. No scleral icterus.  Neck: Normal range of motion. Neck supple. No JVD present. Carotid bruit is not present.  Cardiovascular: Normal rate, regular rhythm and normal heart sounds.  Pulmonary/Chest: Effort normal and breath sounds normal.  Abdominal: Normal appearance. There is no splenomegaly or hepatomegaly.  Musculoskeletal: Normal range of motion.  Neurological: She is alert and oriented to person, place, and time.  Skin: Skin is warm, dry and intact. No rash noted. No pallor.  Psychiatric: She has a normal mood and affect. Her behavior is normal. Judgment and thought content normal.       Assessment & Plan:   Problem List Items Addressed This Visit      Unprioritized   Anxiety    Start Busiprion TID for anxiety symptoms.  Pt ed on esxercise and setting aside time for herself      Relevant Medications  busPIRone (BUSPAR) 5 MG tablet   sertraline (ZOLOFT) 100 MG tablet   Essential hypertension, benign    Borderline high off of medications.  Will continue to monitor every 6 months      Relevant Medications   pravastatin (PRAVACHOL) 40 MG tablet   Hyperlipidemia    Pt states she is willing to start Pravastatin      Relevant Medications   pravastatin (PRAVACHOL) 40 MG tablet       Follow up plan: Return in about 6 weeks (around 05/26/2017).

## 2017-05-05 ENCOUNTER — Emergency Department
Admission: EM | Admit: 2017-05-05 | Discharge: 2017-05-05 | Disposition: A | Discharge status: Home / Self Care | Payer: Self-pay | Attending: Emergency Medicine | Admitting: Emergency Medicine

## 2017-05-05 ENCOUNTER — Other Ambulatory Visit: Payer: Self-pay

## 2017-05-05 ENCOUNTER — Encounter: Payer: Self-pay | Admitting: Emergency Medicine

## 2017-05-05 DIAGNOSIS — Z7902 Long term (current) use of antithrombotics/antiplatelets: Secondary | ICD-10-CM | POA: Insufficient documentation

## 2017-05-05 DIAGNOSIS — Z7982 Long term (current) use of aspirin: Secondary | ICD-10-CM | POA: Insufficient documentation

## 2017-05-05 DIAGNOSIS — Z8673 Personal history of transient ischemic attack (TIA), and cerebral infarction without residual deficits: Secondary | ICD-10-CM | POA: Insufficient documentation

## 2017-05-05 DIAGNOSIS — I10 Essential (primary) hypertension: Secondary | ICD-10-CM | POA: Insufficient documentation

## 2017-05-05 DIAGNOSIS — J0100 Acute maxillary sinusitis, unspecified: Secondary | ICD-10-CM | POA: Insufficient documentation

## 2017-05-05 DIAGNOSIS — F17210 Nicotine dependence, cigarettes, uncomplicated: Secondary | ICD-10-CM | POA: Insufficient documentation

## 2017-05-05 MED ORDER — AMOXICILLIN 500 MG PO CAPS: 500.0000 mg | ORAL_CAPSULE | Freq: Three times a day (TID) | ORAL | 0 refills | Status: DC

## 2017-05-05 MED ORDER — FEXOFENADINE-PSEUDOEPHED ER 60-120 MG PO TB12: 1.0000 | ORAL_TABLET | Freq: Two times a day (BID) | ORAL | 0 refills | Status: DC

## 2017-05-05 MED ORDER — NAPROXEN 500 MG PO TABS: 500.0000 mg | ORAL_TABLET | Freq: Two times a day (BID) | ORAL | 0 refills | Status: DC

## 2017-05-05 MED ORDER — KETOROLAC TROMETHAMINE 30 MG/ML IJ SOLN
30.0000 mg | Freq: Once | INTRAMUSCULAR | Status: AC
  Administered 2017-05-05: 30 mg via INTRAMUSCULAR
  Filled 2017-05-05: qty 1

## 2017-05-05 NOTE — ED Triage Notes (Addendum)
presents with right side of face swelling  Sinus congestion and pressure  States she woke up yesterday am with pain and swelling   Subjective fever  Also having some pain to teeth

## 2017-05-05 NOTE — ED Notes (Signed)
Pt c/o sinus congestion for the past week, states since yesterday having pain and swelling under the eyes. Denies fever..Marland Kitchen

## 2017-05-05 NOTE — ED Provider Notes (Signed)
[facility] Emergency Department Provider Note   ____________________________________________   First MD Initiated Contact with Patient 05/05/17 1019     (approximate)  I have reviewed the triage vital signs and the nursing notes.   HISTORY  Chief Complaint Facial Swelling    HPI Vicki Rosales is a 62 y.o. female patient complain right facial swelling and pain.  Patient states sinus congestion and nasal pressure.  Patient states complaint for approximately 7-10 days.  Patient will worsen yesterday.  Patient also complained of right upper dental pain.  Patient unsure of fever.  Patient has been chills and frontal headache.  Patient rates pain as a 7/10.  Patient described the pain is "achy/pressure".  No palates measured for complaint.   Past Medical History:  Diagnosis Date  . Anxiety   . Cardiomyopathy (HCC)    EF of 35 %  . CVA (cerebral infarction) 2007 and 2008   followed by Dr, Sherryll BurgerShah  . Depression   . GERD (gastroesophageal reflux disease)   . History of seizure    after her stroke  . Hyperlipidemia   . Migraines    ocular  . RLS (restless legs syndrome)   . Seizures (HCC)   . Stroke (HCC)   . TIA (transient ischemic attack) 2014   followed by Dr. Sherryll BurgerShah  . Tobacco abuse     Patient Active Problem List   Diagnosis Date Noted  . Anxiety 04/14/2017  . Medication monitoring encounter 05/18/2015  . Weight loss 05/18/2015  . Essential hypertension, benign 05/18/2015  . Tobacco abuse   . RLS (restless legs syndrome)   . TIA (transient ischemic attack)   . Depression   . Chronic anxiety   . GERD (gastroesophageal reflux disease)   . History of seizure   . Smoker 08/16/2012  . CVA (cerebral vascular accident) (HCC) 07/09/2012  . Heart palpitations 07/09/2012  . 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 Seizure (HCC) 07/09/2012  . Hyperlipidemia 07/09/2012    Past Surgical History:  Procedure Laterality Date  .  CESAREAN SECTION      Prior to Admission medications   Medication Sig Start Date End Date Taking? Authorizing Provider  amoxicillin (AMOXIL) 500 MG capsule Take 1 capsule (500 mg total) by mouth 3 (three) times daily. 05/05/17   Joni ReiningSmith, Sherlyn Ebbert K, PA-C  aspirin 325 MG tablet Take 325 mg by mouth daily.    [provider]  busPIRone (BUSPAR) 5 MG tablet Take 1 tablet (5 mg total) by mouth 3 (three) times daily. 04/14/17   Gabriel CirriWicker, Cheryl, NP  clopidogrel (PLAVIX) 75 MG tablet TAKE ONE TABLET BY MOUTH ONCE DAILY 05/18/15   Lada, Janit BernMelinda P, MD  fexofenadine-pseudoephedrine (ALLEGRA-D) 60-120 MG 12 hr tablet Take 1 tablet by mouth 2 (two) times daily. 05/05/17   Joni ReiningSmith, Maley Venezia K, PA-C  naproxen (NAPROSYN) 500 MG tablet Take 1 tablet (500 mg total) by mouth 2 (two) times daily with a meal. 05/05/17   Joni ReiningSmith, Shanitha Twining K, PA-C  pravastatin (PRAVACHOL) 40 MG tablet Take 1 tablet (40 mg total) by mouth at bedtime. (have labs done 4-5 days before you run out of 2nd bottle in April) 04/14/17   Gabriel CirriWicker, Cheryl, NP  ranitidine (ZANTAC) 150 MG tablet Take 150 mg by mouth at bedtime.    [provider]  rOPINIRole (REQUIP) 2 MG tablet Take 1 tablet (2 mg total) at bedtime by mouth. 02/06/17   Gabriel CirriWicker, Cheryl, NP  sertraline (ZOLOFT) 100 MG tablet Take 1 tablet (  100 mg total) by mouth daily. 04/14/17   Gabriel Cirri, NP    Allergies Patient has no known allergies.  Family History  Problem Relation Age of Onset  . Hypertension Mother   . Heart attack Father   . Heart disease Father        CABG  . Heart disease Sister   . Hyperlipidemia Sister   . Hypertension Sister   . Asthma Sister   . Diabetes Sister   . COPD Sister   . Hypertension Brother   . Hyperlipidemia Brother   . Heart disease Maternal Grandmother        CHF  . Diabetes Maternal Grandfather     Social History Social History   Tobacco Use  . Smoking status: Current Every Day Smoker    Packs/day: 0.50    Years: 30.00    Pack  years: 15.00    Types: Cigarettes  . Smokeless tobacco: Never Used  Substance Use Topics  . Alcohol use: No  . Drug use: No    Review of Systems  Constitutional: No fever/chills Eyes: No visual changes. ENT: Nasal congestion and sinus pressure.  Patient also complained of dental pain to the right upper molar area.  Cardiovascular: Denies chest pain. Respiratory: Denies shortness of breath. Gastrointestinal: No abdominal pain.  No nausea, no vomiting.  No diarrhea.  No constipation. Genitourinary: Negative for dysuria. Musculoskeletal: Negative for back pain. Skin: Negative for rash. Neurological: Negative for headaches, focal weakness or numbness.   ____________________________________________   PHYSICAL EXAM:  VITAL SIGNS: ED Triage Vitals  Enc Vitals Group     BP 05/05/17 0911 (!) 146/75     Pulse Rate 05/05/17 0911 87     Resp 05/05/17 0911 20     Temp 05/05/17 0911 98.5 F (36.9 C)     Temp Source 05/05/17 0911 Oral     SpO2 05/05/17 0911 98 %     Weight 05/05/17 0908 139 lb (63 kg)     Height 05/05/17 0908 5\' 1"  (1.549 m)     Head Circumference --      Peak Flow --      Pain Score 05/05/17 0908 7     Pain Loc --      Pain Edu? --      Excl. in GC? --    Constitutional: Alert and oriented. Well appearing and in no acute distress. Eyes: Conjunctivae are normal. PERRL. EOMI. Head: Atraumatic. Nose: Edematous nasal turbinate thick rhinorrhea.  Bilateral maxillary guarding right greater than left. Mouth/Throat: Mucous membranes are moist.  Oropharynx non-erythematous.  Postnasal drainage Neck: No stridor.  Hematological/Lymphatic/Immunilogical: No cervical lymphadenopathy. Cardiovascular: Normal rate, regular rhythm. Grossly normal heart sounds.  Good peripheral circulation. Respiratory: Normal respiratory effort.  No retractions. Lungs CTAB. Neurologic:  Normal speech and language. No gross focal neurologic deficits are appreciated. No gait instability. Skin:   Skin is warm, dry and intact. No rash noted. Psychiatric: Mood and affect are normal. Speech and behavior are normal.  ____________________________________________   LABS (all labs ordered are listed, but only abnormal results are displayed)  Labs Reviewed - No data to display ____________________________________________  EKG   ____________________________________________  RADIOLOGY  ED MD interpretation:    Official radiology report(s): No results found.  ____________________________________________   PROCEDURES  Procedure(s) performed: None  Procedures  Critical Care performed: No  ____________________________________________   INITIAL IMPRESSION / ASSESSMENT AND PLAN / ED COURSE  As part of my medical decision making, I reviewed the following  data within the electronic MEDICAL RECORD NUMBER    Sinusitis with sinus headache.  Patient given discharge care instruction.  Patient advised take medication as directed.  Patient given a work note advised follow-up PCP as needed.      ____________________________________________   FINAL CLINICAL IMPRESSION(S) / ED DIAGNOSES  Final diagnoses:  Subacute maxillary sinusitis     ED Discharge Orders        Ordered    amoxicillin (AMOXIL) 500 MG capsule  3 times daily     05/05/17 1026    fexofenadine-pseudoephedrine (ALLEGRA-D) 60-120 MG 12 hr tablet  2 times daily     05/05/17 1026    naproxen (NAPROSYN) 500 MG tablet  2 times daily with meals     05/05/17 1026       Note:  This document was prepared using Dragon voice recognition software and may include unintentional dictation errors.    Joni Reining, PA-C 05/05/17 1032    Emily Filbert, MD 05/05/17 1228

## 2017-11-09 ENCOUNTER — Telehealth: Payer: Self-pay | Admitting: Unknown Physician Specialty

## 2017-11-09 MED ORDER — SERTRALINE HCL 100 MG PO TABS: 100.0000 mg | ORAL_TABLET | Freq: Every day | ORAL | 5 refills | Status: DC

## 2017-11-09 NOTE — Telephone Encounter (Signed)
Copied from CRM [id number]. Topic: Quick Communication - See Telephone Encounter >> Nov 09, 2017  4:42 PM Windy KalataMichael, Louis Ivery L, NT wrote: CRM for notification. See Telephone encounter for: 11/09/17.  Patient is requesting a refill on sertraline (ZOLOFT) 100 MG tablet. Please advise.  [facility] [address] - [place], [place] - [address] [place] [id number] Phone: [phone] Fax: [phone]

## 2018-05-03 ENCOUNTER — Other Ambulatory Visit: Payer: Self-pay | Admitting: Nurse Practitioner

## 2018-05-03 ENCOUNTER — Telehealth: Payer: Self-pay | Admitting: Unknown Physician Specialty

## 2018-05-03 MED ORDER — SERTRALINE HCL 100 MG PO TABS: 100.0000 mg | ORAL_TABLET | Freq: Every day | ORAL | 3 refills | Status: DC

## 2018-05-03 NOTE — Telephone Encounter (Signed)
Refill sent.

## 2018-05-03 NOTE — Telephone Encounter (Signed)
Copied from CRM [id number]. Topic: Quick Communication - Rx Refill/Question >> May 03, 2018  3:13 PM Maia Petties wrote: Medication: sertraline (ZOLOFT) 100 MG tablet - pt has 1 day left - pt scheduled transfer OV to [facility] 05/14/2018 - pt takes 1/day - 30 day RX requested  Has the patient contacted their pharmacy? No - needed to make appt - last OV 03/2017 with Elnita Maxwell Preferred Pharmacy (with phone number or street name): [facility] [address] [place] - [address] [phone] (Phone) [phone] (Fax)

## 2018-05-03 NOTE — Progress Notes (Signed)
Sertraline refill sent to pharmacy.

## 2018-05-14 ENCOUNTER — Ambulatory Visit (INDEPENDENT_AMBULATORY_CARE_PROVIDER_SITE_OTHER): Payer: Self-pay | Admitting: Family Medicine

## 2018-05-14 ENCOUNTER — Ambulatory Visit: Payer: Self-pay | Admitting: Nurse Practitioner

## 2018-05-14 ENCOUNTER — Encounter: Payer: Self-pay | Admitting: Nurse Practitioner

## 2018-05-14 ENCOUNTER — Encounter: Payer: Self-pay | Admitting: Family Medicine

## 2018-05-14 VITALS — BP 150/89 | HR 92 | Temp 98.6°F | Wt 130.4 lb

## 2018-05-14 DIAGNOSIS — J0100 Acute maxillary sinusitis, unspecified: Secondary | ICD-10-CM

## 2018-05-14 MED ORDER — AMOXICILLIN-POT CLAVULANATE 875-125 MG PO TABS: 1.0000 | ORAL_TABLET | Freq: Two times a day (BID) | ORAL | 0 refills | Status: DC

## 2018-05-14 NOTE — Progress Notes (Signed)
BP (!) (P) 153/93 (BP Location: Left Arm, Cuff Size: Small)   Pulse 92   Temp 98.6 F (37 C) (Oral)   Wt 130 lb 6.4 oz (59.1 kg)   SpO2 97%   BMI 24.64 kg/m    Subjective:    Patient ID: Vicki Rosales, female    DOB: October 11, 1955, 63 y.o.   MRN: [id number]  HPI: Vicki Rosales is a 63 y.o. female  Chief Complaint  Patient presents with  . URI    pt states she has had a cough, congestion, and sinus pressure   Pt c/o 2-3 months of congestion, sinus drainage and pressure, and a cough that never fully resolved that's worse the past week. Taking mucinex, tussin DM, and tylenol. No known hx of allergies. Several sick contacts.   Relevant past medical, surgical, family and social history reviewed and updated as indicated. Interim medical history since our last visit reviewed. Allergies and medications reviewed and updated.  Review of Systems  Per HPI unless specifically indicated above     Objective:    BP (!) (P) 153/93 (BP Location: Left Arm, Cuff Size: Small)   Pulse 92   Temp 98.6 F (37 C) (Oral)   Wt 130 lb 6.4 oz (59.1 kg)   SpO2 97%   BMI 24.64 kg/m   Wt Readings from Last 3 Encounters:  05/14/18 130 lb 6.4 oz (59.1 kg)  05/05/17 139 lb (63 kg)  04/14/17 139 lb 8 oz (63.3 kg)    Physical Exam Vitals signs and nursing note reviewed.  Constitutional:      Appearance: Normal appearance.  HENT:     Head: Atraumatic.     Comments: B/l frontal sinuses ttp     Right Ear: Tympanic membrane and external ear normal.     Left Ear: Tympanic membrane and external ear normal.     Nose: Congestion present.     Mouth/Throat:     Mouth: Mucous membranes are moist.     Pharynx: Posterior oropharyngeal erythema present.  Eyes:     Extraocular Movements: Extraocular movements intact.     Conjunctiva/sclera: Conjunctivae normal.  Neck:     Musculoskeletal: Normal range of motion and neck supple.  Cardiovascular:     Rate and Rhythm: Normal rate and regular  rhythm.     Heart sounds: Normal heart sounds.  Pulmonary:     Effort: Pulmonary effort is normal.     Breath sounds: Normal breath sounds. No wheezing.  Musculoskeletal: Normal range of motion.  Skin:    General: Skin is warm and dry.  Neurological:     Mental Status: She is alert and oriented to person, place, and time.  Psychiatric:        Mood and Affect: Mood normal.        Thought Content: Thought content normal.     Results for orders placed or performed during the hospital encounter of 01/01/17  CBC  Result Value Ref Range   WBC 12.0 (H) 3.6 - 11.0 K/uL   RBC 5.43 (H) 3.80 - 5.20 MIL/uL   Hemoglobin 16.1 (H) 12.0 - 16.0 g/dL   HCT 47.0 (H) 96.2 - 83.6 %   MCV 88.6 80.0 - 100.0 fL   MCH 29.7 26.0 - 34.0 pg   MCHC 33.5 32.0 - 36.0 g/dL   RDW 62.9 47.6 - 54.6 %   Platelets 223 150 - 440 K/uL  Comprehensive metabolic panel  Result Value Ref Range   Sodium 135  135 - 145 mmol/L   Potassium 3.7 3.5 - 5.1 mmol/L   Chloride 97 (L) 101 - 111 mmol/L   CO2 25 22 - 32 mmol/L   Glucose, Bld 101 (H) 65 - 99 mg/dL   BUN 29 (H) 6 - 20 mg/dL   Creatinine, Ser 9.60 (H) 0.44 - 1.00 mg/dL   Calcium 9.8 8.9 - 45.4 mg/dL   Total Protein 8.4 (H) 6.5 - 8.1 g/dL   Albumin 4.4 3.5 - 5.0 g/dL   AST 23 15 - 41 U/L   ALT 12 (L) 14 - 54 U/L   Alkaline Phosphatase 93 38 - 126 U/L   Total Bilirubin 1.2 0.3 - 1.2 mg/dL   GFR calc non Af Amer 47 (L) >60 mL/min   GFR calc Af Amer 54 (L) >60 mL/min   Anion gap 13 5 - 15  Troponin I  Result Value Ref Range   Troponin I <0.03 <0.03 ng/mL  Lipase, blood  Result Value Ref Range   Lipase 20 11 - 51 U/L  Urinalysis, Complete w Microscopic  Result Value Ref Range   Color, Urine YELLOW (A) YELLOW   APPearance CLEAR (A) CLEAR   Specific Gravity, Urine 1.012 1.005 - 1.030   pH 6.0 5.0 - 8.0   Glucose, UA NEGATIVE NEGATIVE mg/dL   Hgb urine dipstick SMALL (A) NEGATIVE   Bilirubin Urine NEGATIVE NEGATIVE   Ketones, ur 20 (A) NEGATIVE mg/dL    Protein, ur NEGATIVE NEGATIVE mg/dL   Nitrite NEGATIVE NEGATIVE   Leukocytes, UA SMALL (A) NEGATIVE   RBC / HPF 0-5 0 - 5 RBC/hpf   WBC, UA 6-30 0 - 5 WBC/hpf   Bacteria, UA NONE SEEN NONE SEEN   Squamous Epithelial / LPF NONE SEEN NONE SEEN   Mucus PRESENT       Assessment & Plan:   Problem List Items Addressed This Visit    None    Visit Diagnoses    Acute maxillary sinusitis, recurrence not specified    -  Primary   Tx with augmentin, sinus rinses, humidifier. F/u if worsening or not improving. Recommended good allergy regimen given chronicity   Relevant Medications   amoxicillin-clavulanate (AUGMENTIN) 875-125 MG tablet       Follow up plan: Return if symptoms worsen or fail to improve.

## 2018-05-17 ENCOUNTER — Ambulatory Visit: Payer: Self-pay | Admitting: Nurse Practitioner

## 2018-09-08 ENCOUNTER — Encounter: Payer: Self-pay | Admitting: Nurse Practitioner

## 2018-09-08 ENCOUNTER — Ambulatory Visit: Payer: Self-pay | Admitting: *Deleted

## 2018-09-08 ENCOUNTER — Telehealth: Payer: Self-pay | Admitting: Nurse Practitioner

## 2018-09-08 ENCOUNTER — Ambulatory Visit (INDEPENDENT_AMBULATORY_CARE_PROVIDER_SITE_OTHER): Payer: Self-pay | Admitting: Nurse Practitioner

## 2018-09-08 ENCOUNTER — Ambulatory Visit: Payer: Self-pay | Admitting: Pharmacist

## 2018-09-08 ENCOUNTER — Other Ambulatory Visit: Payer: Self-pay

## 2018-09-08 DIAGNOSIS — E7800 Pure hypercholesterolemia, unspecified: Secondary | ICD-10-CM

## 2018-09-08 DIAGNOSIS — I63341 Cerebral infarction due to thrombosis of right cerebellar artery: Secondary | ICD-10-CM

## 2018-09-08 DIAGNOSIS — Z87898 Personal history of other specified conditions: Secondary | ICD-10-CM

## 2018-09-08 DIAGNOSIS — Z8673 Personal history of transient ischemic attack (TIA), and cerebral infarction without residual deficits: Secondary | ICD-10-CM

## 2018-09-08 DIAGNOSIS — G43109 Migraine with aura, not intractable, without status migrainosus: Secondary | ICD-10-CM

## 2018-09-08 DIAGNOSIS — F17210 Nicotine dependence, cigarettes, uncomplicated: Secondary | ICD-10-CM

## 2018-09-08 MED ORDER — NORTRIPTYLINE HCL 25 MG PO CAPS: 25.0000 mg | ORAL_CAPSULE | Freq: Every day | ORAL | 5 refills | Status: DC

## 2018-09-08 MED ORDER — CLOPIDOGREL BISULFATE 75 MG PO TABS: ORAL_TABLET | ORAL | 3 refills | Status: DC

## 2018-09-08 MED ORDER — PRAVASTATIN SODIUM 40 MG PO TABS: 40.0000 mg | ORAL_TABLET | Freq: Every day | ORAL | 6 refills | Status: DC

## 2018-09-08 NOTE — Telephone Encounter (Unsigned)
Copied from [place] [id number]. Topic: Quick Communication - Rx Refill/Question >> Sep 08, 2018  4:58 PM Pauline Good wrote: Medication: Plavix  Has the patient contacted their pharmacy? yes (Agent: If no, request that the patient contact the pharmacy for the refill.) (Agent: If yes, when and what did the pharmacy advise?)  Preferred Pharmacy (with phone number or street name): [facility]  Agent: Please be advised that RX refills may take up to 3 business days. We ask that you follow-up with your pharmacy.

## 2018-09-08 NOTE — Chronic Care Management (AMB) (Signed)
Chronic Care Management   Note  09/08/2018 Name: Vicki Rosales MRN: [id number] DOB: 07/01/1955   Subjective:  Vicki Rosales is a 63 y.o. year old female who is a primary care patient of Cannady, Dorie RankJolene T, NP. The CCM team was consulted for assistance with chronic disease management and care coordination needs.    Review of patient status, including review of consultants reports, laboratory and other test data, was performed as part of comprehensive evaluation and provision of chronic care management services.   Objective:  Lab Results  Component Value Date   CREATININE 1.23 (H) 01/01/2017   CREATININE 0.92 05/18/2015   CREATININE 0.87 07/06/2012    Lab Results  Component Value Date   HGBA1C 5.9 07/07/2012       Component Value Date/Time   CHOL 263 (H) 05/18/2015 1631   CHOL 268 (H) 07/07/2012 0426   TRIG 260 (H) 05/18/2015 1631   TRIG 142 07/07/2012 0426   HDL 49 05/18/2015 1631   HDL 44 07/07/2012 0426   VLDL 28 07/07/2012 0426   LDLCALC 162 (H) 05/18/2015 1631   LDLCALC 196 (H) 07/07/2012 0426    Clinical ASCVD: Yes    BP Readings from Last 3 Encounters:  09/08/18 138/86  05/14/18 (!) (P) 153/93  05/05/17 140/82    Medications Reviewed Today    Reviewed by Lourena Simmondsravis, Laiken Nohr E, RPH (Pharmacist) on 09/08/18 at 1457  Med List Status: <None>  Medication Order Taking? Sig Documenting Provider Last Dose Status Informant  aspirin 325 MG tablet [id number] Yes Take 325 mg by mouth daily. [provider] Taking Active   busPIRone (BUSPAR) 5 MG tablet [id number] No Take 1 tablet (5 mg total) by mouth 3 (three) times daily.  Patient not taking:  Reported on 05/14/2018   Gabriel CirriWicker, Cheryl, NP Not Taking Active   clopidogrel (PLAVIX) 75 MG tablet [id number] No TAKE ONE TABLET BY MOUTH ONCE DAILY  Patient not taking:  Reported on 09/08/2018   Aura Dialsannady, Jolene T, NP Not Taking Active   fexofenadine-pseudoephedrine (ALLEGRA-D) 60-120 MG 12 hr tablet [id number] No  Take 1 tablet by mouth 2 (two) times daily.  Patient not taking:  Reported on 09/08/2018   Joni ReiningSmith, Ronald K, PA-C Not Taking Active   naproxen (NAPROSYN) 500 MG tablet [id number] No Take 1 tablet (500 mg total) by mouth 2 (two) times daily with a meal.  Patient not taking:  Reported on 05/14/2018   Joni ReiningSmith, Ronald K, PA-C Not Taking Active   nortriptyline (PAMELOR) 25 MG capsule [id number] No Take 1 capsule (25 mg total) by mouth at bedtime.  Patient not taking:  Reported on 09/08/2018   Aura Dialsannady, Jolene T, NP Not Taking Active   pravastatin (PRAVACHOL) 40 MG tablet [id number] No Take 1 tablet (40 mg total) by mouth at bedtime. (have labs done 4-5 days before you run out of 2nd bottle in April)  Patient not taking:  Reported on 09/08/2018   Aura Dialsannady, Jolene T, NP Not Taking Active   rOPINIRole (REQUIP) 2 MG tablet [id number] Yes Take 1 tablet (2 mg total) at bedtime by mouth. Gabriel CirriWicker, Cheryl, NP Taking Active   sertraline (ZOLOFT) 100 MG tablet [id number] Yes Take 1 tablet (100 mg total) by mouth daily. Marjie Skiffannady, Jolene T, NP Taking Active            Assessment:   Goals Addressed            This Visit's Progress     Patient Stated   . "I can't afford  my medications" (pt-stated)       Current Barriers:  . Financial barriers - patient is uninsured and and has been unable to afford clopidogrel or pravastatin. Has continued aspirin 325 mg daily  . Hx CVA x 3; previously seen by neurology and cardiology  Pharmacist Clinical Goal(s):  Marland Kitchen Over the next 30 days, patient will work with pharmacist and primary care team to address needs related to medication access  Interventions: . Comprehensive medication review performed.  . Explained [facility] benefit for uninsured patients of [facility]. Will contact pharmacists at [facility] and ask to outreach patient to discuss financial documentation needed to screen for eligibility . Will collaborate with primary care provider Marnee Guarneri,  NP to have all chronic medications sent to [facility]; added to preferred pharmacies  Patient Self Care Activities:  . Currently UNABLE TO independently afford and remain adherent to prescribed medications  Initial goal documentation        Plan: - Will outreach patient/[facility] in 1-2 weeks to ensure patient was connected with this service  Catie Darnelle Maffucci, PharmD Clinical Pharmacist [place] [phone]

## 2018-09-08 NOTE — Assessment & Plan Note (Addendum)
Chronic with no maintenance medication + WNL neuro exam today.  She refuses labs or imaging due to financial strain.  She agrees to referral to neurology.  CCM referral placed and to bedside to talk to patient, discuss assistance options for patient.  Script for nortriptyline 25 MG QHS sent, discussed with patient if she continues to have intermittent episodes of ocular migraines with this medication on board or if has worsening neuro symptoms she is to IMMEDIATELY go to ER. She was able to verbalize this plan back to provider.  Return in 2 weeks for follow-up.

## 2018-09-08 NOTE — Patient Instructions (Addendum)
Migraine Headache  A migraine headache is a very strong throbbing pain on one side or both sides of your head. Migraines can also cause other symptoms. Talk with your doctor about what things may bring on (trigger) your migraine headaches. Follow these instructions at home: Medicines  Take over-the-counter and prescription medicines only as told by your doctor.  Do not drive or use heavy machinery while taking prescription pain medicine.  To prevent or treat constipation while you are taking prescription pain medicine, your doctor may recommend that you: ? Drink enough fluid to keep your pee (urine) clear or pale yellow. ? Take over-the-counter or prescription medicines. ? Eat foods that are high in fiber. These include fresh fruits and vegetables, whole grains, and beans. ? Limit foods that are high in fat and processed sugars. These include fried and sweet foods. Lifestyle  Avoid alcohol.  Do not use any products that contain nicotine or tobacco, such as cigarettes and e-cigarettes. If you need help quitting, ask your doctor.  Get at least 8 hours of sleep every night.  Limit your stress. General instructions   Keep a journal to find out what may bring on your migraines. For example, write down: ? What you eat and drink. ? How much sleep you get. ? Any change in what you eat or drink. ? Any change in your medicines.  If you have a migraine: ? Avoid things that make your symptoms worse, such as bright lights. ? It may help to lie down in a dark, quiet room. ? Do not drive or use heavy machinery. ? Ask your doctor what activities are safe for you.  Keep all follow-up visits as told by your doctor. This is important. Contact a doctor if:  You get a migraine that is different or worse than your usual migraines. Get help right away if:  Your migraine gets very bad.  You have a fever.  You have a stiff neck.  You have trouble seeing.  Your muscles feel weak or like  you cannot control them.  You start to lose your balance a lot.  You start to have trouble walking.  You pass out (faint). This information is not intended to replace advice given to you by your health care provider. Make sure you discuss any questions you have with your health care provider. Document Released: 12/25/2007 Document Revised: 12/09/2017 Document Reviewed: 09/03/2015 Elsevier Interactive Patient Education  2019 [employer].  Stroke Prevention Some medical conditions and lifestyle choices can lead to a higher risk for a stroke. You can help to prevent a stroke by making nutrition, lifestyle, and other changes. What nutrition changes can be made?  Eat healthy foods. Choose foods that are high in fiber. These include: Fresh fruits. Fresh vegetables. Whole grains. Eat at least 5 or more servings of fruits and vegetables each day. Try to fill half of your plate at each meal with fruits and vegetables. Choose lean protein foods. These include: Lowfat (lean) cuts of meat. Chicken without skin. Fish. Tofu. Beans. Nuts. Eat low-fat dairy products. Avoid foods that: Are high in salt (sodium). Have saturated fat. Have trans fat. Have cholesterol. Are processed. Are premade. Follow eating guidelines as told by your doctor. These may include: Reducing how many calories you eat and drink each day. Limiting how much salt you eat or drink each day to 1,500 milligrams (mg). Using only healthy fats for cooking. These include: Olive oil. Canola oil. Sunflower oil. Counting how many carbohydrates you eat  and drink each day. What lifestyle changes can be made? Try to stay at a healthy weight. Talk to your doctor about what a good weight is for you. Get at least 30 minutes of moderate physical activity at least 5 days a week. This can include: Fast walking. Biking. Swimming. Do not use any products that have nicotine or tobacco. This includes cigarettes and e-cigarettes. If  you need help quitting, ask your doctor. Avoid being around tobacco smoke in general. Limit how much alcohol you drink to no more than 1 drink a day for nonpregnant women and 2 drinks a day for men. One drink equals 12 oz of beer, 5 oz of wine, or 1 oz of hard liquor. Do not use drugs. Avoid taking birth control pills. Talk to your doctor about the risks of taking birth control pills if: You are over 62 years old. You smoke. You get migraines. You have had a blood clot. What other changes can be made? Manage your cholesterol. It is important to eat a healthy diet. If your cholesterol cannot be managed through your diet, you may also need to take medicines. Take medicines as told by your doctor. Manage your diabetes. It is important to eat a healthy diet and to exercise regularly. If your blood sugar cannot be managed through diet and exercise, you may need to take medicines. Take medicines as told by your doctor. Control your high blood pressure (hypertension). Try to keep your blood pressure below 130/80. This can help lower your risk of stroke. It is important to eat a healthy diet and to exercise regularly. If your blood pressure cannot be managed through diet and exercise, you may need to take medicines. Take medicines as told by your doctor. Ask your doctor if you should check your blood pressure at home. Have your blood pressure checked every year. Do this even if your blood pressure is normal. Talk to your doctor about getting checked for a sleep disorder. Signs of this can include: Snoring a lot. Feeling very tired. Take over-the-counter and prescription medicines only as told by your doctor. These may include aspirin or blood thinners (antiplatelets or anticoagulants). Make sure that any other medical conditions you have are managed. Where to find more information American Stroke Association: www.strokeassociation.org National Stroke Association: www.stroke.org Get help right  away if: You have any symptoms of stroke. "BE FAST" is an easy way to remember the main warning signs: B - Balance. Signs are dizziness, sudden trouble walking, or loss of balance. E - Eyes. Signs are trouble seeing or a sudden change in how you see. F - Face. Signs are sudden weakness or loss of feeling of the face, or the face or eyelid drooping on one side. A - Arms. Signs are weakness or loss of feeling in an arm. This happens suddenly and usually on one side of the body. S - Speech. Signs are sudden trouble speaking, slurred speech, or trouble understanding what people say. T - Time. Time to call emergency services. Write down what time symptoms started. You have other signs of stroke, such as: A sudden, very bad headache with no known cause. Feeling sick to your stomach (nausea). Throwing up (vomiting). Jerky movements you cannot control (seizure). These symptoms may represent a serious problem that is an emergency. Do not wait to see if the symptoms will go away. Get medical help right away. Call your local emergency services (911 in the [place] Do not drive yourself to the hospital. Summary You  can prevent a stroke by eating healthy, exercising, not smoking, drinking less alcohol, and treating other health problems, such as diabetes, high blood pressure, or high cholesterol. Do not use any products that contain nicotine or tobacco, such as cigarettes and e-cigarettes. Get help right away if you have any signs or symptoms of a stroke. This information is not intended to replace advice given to you by your health care provider. Make sure you discuss any questions you have with your health care provider. Document Released: 09/16/2011 Document Revised: 06/18/2016 Document Reviewed: 06/18/2016 Elsevier Interactive Patient Education  2019 [employer]

## 2018-09-08 NOTE — Progress Notes (Signed)
BP 138/86   Pulse 86 Comment: apical  Temp 99 F (37.2 C) (Oral)   SpO2 98%    Subjective:    Patient ID: Vicki Rosales, female    DOB: [birth year], 63 y.o.   MRN: [id number]  HPI: Vicki Rosales is a 63 y.o. female  Chief Complaint  Patient presents with  . Migraine    pt states she has had an ocular migraine since Saturday   MIGRAINES She reports she has ocular migraines on/off.  Reports she has not been treated for these by PCP in past, was treated by ophthalmologist.  Current episode has been on/off since weekend.  She feels that light and heat have been triggering them, has been working out side in the garden.  Initial episode started Saturday, was going through phone looking at pictures, then suddenly light changed in eye.  No pain during this episode.  Had blurriness in left eye.  The episodes last 15-30 minutes, then return 1-2 hours later.  Since Saturday has had about 5 episodes a day off/on.  She does not have pain with the episodes, only has vision changes.  She has history of stroke last in 2014, presented with slight slur in speech and decreased coordination.   Never had any vision changes with strokes.  Has had "about 3 strokes" in past, on review of records this is noted 2007, 2009, 2014.  Has been taking ASA, but not taking Plavix or statin (has been a long while since she took due to cost and being self pay).  Was followed by Dr. Mariah MillingGollan and last seen in 2014. Has not kept up with neurology or cardiology specialist appointments due to cost.  Last chronic illness visit in this clinic 04/14/2017.  She endorses in past she has been able to close eyes and relax and headache goes away.  In a year period in past would have about 5 times in a whole year, current episodes are more frequent. Denies any slurred speech, decreased coordination, loss of strength.  She is also noted to have history of seizure disorder, but she reports she has not had seizure since her first stroke.   No maintenance medications.  Continues to smoke and is not interested in quitting.  Duration: days Onset: gradual Severity: no pain Quality: none Frequency: 15-30 minutes about 5 episodes a day since Saturday Location:  Headache duration: Radiation: stays mostly on left outer corner of left eye and inner corner of right eye Time of day headache occurs:  Alleviating factors: closes eyes and goes away, but returns Aggravating factors: heat and increased light Headache status at time of visit: asymptomatic Treatments attempted: Treatments attempted: Excedrin migraine   Aura: yes Nausea:  no Vomiting: no Photophobia:  yes Phonophobia:  no Effect on social functioning:  yes Numbers of missed days of school/work each month: none Confusion:  no Gait disturbance/ataxia:  no Behavioral changes:  no Fevers:  no  Relevant past medical, surgical, family and social history reviewed and updated as indicated. Interim medical history since our last visit reviewed. Allergies and medications reviewed and updated.  Review of Systems  Constitutional: Negative for activity change, appetite change, diaphoresis, fatigue and fever.  Eyes: Positive for photophobia. Negative for pain and discharge.  Respiratory: Negative for cough, chest tightness and shortness of breath.   Cardiovascular: Negative for chest pain, palpitations and leg swelling.  Gastrointestinal: Negative for abdominal distention, abdominal pain, constipation, diarrhea, nausea and vomiting.  Endocrine: Negative for cold intolerance and  heat intolerance.  Neurological: Positive for headaches. Negative for dizziness, tremors, seizures, syncope, facial asymmetry, speech difficulty, weakness, light-headedness and numbness.  Psychiatric/Behavioral: Negative.     Per HPI unless specifically indicated above     Objective:    BP 138/86   Pulse 86 Comment: apical  Temp 99 F (37.2 C) (Oral)   SpO2 98%   Wt Readings from Last 3  Encounters:  05/14/18 130 lb 6.4 oz (59.1 kg)  05/05/17 139 lb (63 kg)  04/14/17 139 lb 8 oz (63.3 kg)    Physical Exam Vitals signs and nursing note reviewed.  Constitutional:      General: She is awake. She is not in acute distress.    Appearance: She is well-developed. She is not ill-appearing.  HENT:     Head: Normocephalic and atraumatic.     Jaw: No tenderness or pain on movement.     Right Ear: Hearing, tympanic membrane, ear canal and external ear normal.     Left Ear: Hearing, tympanic membrane, ear canal and external ear normal.     Nose: Nose normal.     Mouth/Throat:     Mouth: Mucous membranes are moist.  Eyes:     General: Lids are normal. No visual field deficit.       Right eye: No discharge.        Left eye: No discharge.     Extraocular Movements: Extraocular movements intact.     Conjunctiva/sclera: Conjunctivae normal.     Pupils: Pupils are equal, round, and reactive to light.     Visual Fields: Right eye visual fields normal and left eye visual fields normal.  Neck:     Musculoskeletal: Normal range of motion and neck supple.     Thyroid: No thyromegaly.     Vascular: No carotid bruit.  Cardiovascular:     Rate and Rhythm: Normal rate and regular rhythm.     Heart sounds: Normal heart sounds. No murmur. No gallop.   Pulmonary:     Effort: Pulmonary effort is normal.     Breath sounds: Normal breath sounds.  Abdominal:     General: Bowel sounds are normal.     Palpations: Abdomen is soft. There is no hepatomegaly or splenomegaly.  Musculoskeletal:     Right lower leg: No edema.     Left lower leg: No edema.  Lymphadenopathy:     Cervical: No cervical adenopathy.  Skin:    General: Skin is warm and dry.  Neurological:     Mental Status: She is alert and oriented to person, place, and time.     Cranial Nerves: Cranial nerves are intact. No cranial nerve deficit or facial asymmetry.     Motor: Motor function is intact.     Coordination: Coordination  is intact.     Gait: Gait is intact.     Deep Tendon Reflexes: Reflexes are normal and symmetric.  Psychiatric:        Attention and Perception: Attention normal.        Mood and Affect: Mood normal.        Behavior: Behavior normal. Behavior is cooperative.        Thought Content: Thought content normal.        Judgment: Judgment normal.     Results for orders placed or performed during the hospital encounter of 01/01/17  CBC  Result Value Ref Range   WBC 12.0 (H) 3.6 - 11.0 K/uL   RBC 5.43 (H) 3.80 -  5.20 MIL/uL   Hemoglobin 16.1 (H) 12.0 - 16.0 g/dL   HCT 09.848.1 (H) 11.935.0 - 14.747.0 %   MCV 88.6 80.0 - 100.0 fL   MCH 29.7 26.0 - 34.0 pg   MCHC 33.5 32.0 - 36.0 g/dL   RDW 82.914.5 56.211.5 - 13.014.5 %   Platelets 223 150 - 440 K/uL  Comprehensive metabolic panel  Result Value Ref Range   Sodium 135 135 - 145 mmol/L   Potassium 3.7 3.5 - 5.1 mmol/L   Chloride 97 (L) 101 - 111 mmol/L   CO2 25 22 - 32 mmol/L   Glucose, Bld 101 (H) 65 - 99 mg/dL   BUN 29 (H) 6 - 20 mg/dL   Creatinine, Ser 8.651.23 (H) 0.44 - 1.00 mg/dL   Calcium 9.8 8.9 - 78.410.3 mg/dL   Total Protein 8.4 (H) 6.5 - 8.1 g/dL   Albumin 4.4 3.5 - 5.0 g/dL   AST 23 15 - 41 U/L   ALT 12 (L) 14 - 54 U/L   Alkaline Phosphatase 93 38 - 126 U/L   Total Bilirubin 1.2 0.3 - 1.2 mg/dL   GFR calc non Af Amer 47 (L) >60 mL/min   GFR calc Af Amer 54 (L) >60 mL/min   Anion gap 13 5 - 15  Troponin I  Result Value Ref Range   Troponin I <0.03 <0.03 ng/mL  Lipase, blood  Result Value Ref Range   Lipase 20 11 - 51 U/L  Urinalysis, Complete w Microscopic  Result Value Ref Range   Color, Urine YELLOW (A) YELLOW   APPearance CLEAR (A) CLEAR   Specific Gravity, Urine 1.012 1.005 - 1.030   pH 6.0 5.0 - 8.0   Glucose, UA NEGATIVE NEGATIVE mg/dL   Hgb urine dipstick SMALL (A) NEGATIVE   Bilirubin Urine NEGATIVE NEGATIVE   Ketones, ur 20 (A) NEGATIVE mg/dL   Protein, ur NEGATIVE NEGATIVE mg/dL   Nitrite NEGATIVE NEGATIVE   Leukocytes, UA SMALL  (A) NEGATIVE   RBC / HPF 0-5 0 - 5 RBC/hpf   WBC, UA 6-30 0 - 5 WBC/hpf   Bacteria, UA NONE SEEN NONE SEEN   Squamous Epithelial / LPF NONE SEEN NONE SEEN   Mucus PRESENT       Assessment & Plan:   Problem List Items Addressed This Visit      Cardiovascular and Mediastinum   Ocular migraine    Chronic with no maintenance medication + WNL neuro exam today.  She refuses labs or imaging due to financial strain.  She agrees to referral to neurology.  CCM referral placed and to bedside to talk to patient, discuss assistance options for patient.  Script for nortriptyline 25 MG QHS sent, discussed with patient if she continues to have intermittent episodes of ocular migraines with this medication on board or if has worsening neuro symptoms she is to IMMEDIATELY go to ER. She was able to verbalize this plan back to provider.  Return in 2 weeks for follow-up.        Relevant Medications   pravastatin (PRAVACHOL) 40 MG tablet   nortriptyline (PAMELOR) 25 MG capsule   Other Relevant Orders   Ambulatory referral to Neurology     Other   History of CVA (cerebrovascular accident)    CVA's in 2007, 2009, 2014.   WNL neuro exam today.  She has not been taking Plavix or Statin, has been taking ASA only.  CCM referral placed and at bedside to talk to patient and discuss assistance  options.  Will restart Plavix and statin at this time.  She agrees to referral to neurology, but does not wish referral to cardiology until assistance available to help afford visits, have recommended ongoing follow-up with specialist.  She refuses labs or imaging today due to financial strain.  Have recommended complete cessation of smoking.  Discussed s/s of stroke and advised if present she is to immediately go to ER.      Relevant Orders   Ambulatory referral to Neurology   Hyperlipidemia    Restart statin for stroke prevention.  Refuses labs today.      Relevant Medications   pravastatin (PRAVACHOL) 40 MG tablet    Nicotine dependence, cigarettes, uncomplicated    I have recommended complete cessation of tobacco use. I have discussed various options available for assistance with tobacco cessation including over the counter methods (Nicotine gum, patch and lozenges). We also discussed prescription options (Chantix, Nicotine Inhaler / Nasal Spray). The patient is not interested in pursuing any prescription tobacco cessation options at this time.      History of seizure    Reports no recent seizures, none since her first stroke.  Currently no maintenance medications.  Have recommended referral back to neurology.  Will place this referral today and CCM team will work on determining assistance available to help with financial strain.      Relevant Orders   Ambulatory referral to Neurology       Follow up plan: Return in about 2 weeks (around 09/22/2018) for Migraine follow-up.

## 2018-09-08 NOTE — Assessment & Plan Note (Signed)
I have recommended complete cessation of tobacco use. I have discussed various options available for assistance with tobacco cessation including over the counter methods (Nicotine gum, patch and lozenges). We also discussed prescription options (Chantix, Nicotine Inhaler / Nasal Spray). The patient is not interested in pursuing any prescription tobacco cessation options at this time.  

## 2018-09-08 NOTE — Patient Instructions (Signed)
Visit Information  Goals Addressed            This Visit's Progress     Patient Stated   . "I can't afford my medications" (pt-stated)       Current Barriers:  . Financial barriers - patient is uninsured and and has been unable to afford clopidogrel or pravastatin. Has continued aspirin 325 mg daily  . Hx CVA x 3; previously seen by neurology and cardiology  Pharmacist Clinical Goal(s):  Marland Kitchen Over the next 30 days, patient will work with pharmacist and primary care team to address needs related to medication access  Interventions: . Comprehensive medication review performed.  . Explained [facility] benefit for uninsured patients of [facility]. Will contact pharmacists at [facility] and ask to outreach patient to discuss financial documentation needed to screen for eligibility . Will collaborate with primary care provider Marnee Guarneri, NP to have all chronic medications sent to [facility]; added to preferred pharmacies  Patient Self Care Activities:  . Currently UNABLE TO independently afford and remain adherent to prescribed medications  Initial goal documentation        The patient verbalized understanding of instructions provided today and declined a print copy of patient instruction materials.   Plan: - Will outreach patient/[facility] in 1-2 weeks to ensure patient was connected with this service  Catie Darnelle Maffucci, PharmD Clinical Pharmacist [place] [phone]

## 2018-09-08 NOTE — Assessment & Plan Note (Signed)
Restart statin for stroke prevention.  Refuses labs today.

## 2018-09-08 NOTE — Assessment & Plan Note (Addendum)
CVA's in 2007, 2009, 2014.   WNL neuro exam today.  She has not been taking Plavix or Statin, has been taking ASA only.  CCM referral placed and at bedside to talk to patient and discuss assistance options.  Will restart Plavix and statin at this time.  She agrees to referral to neurology, but does not wish referral to cardiology until assistance available to help afford visits, have recommended ongoing follow-up with specialist.  She refuses labs or imaging today due to financial strain.  Have recommended complete cessation of smoking.  Discussed s/s of stroke and advised if present she is to immediately go to ER.

## 2018-09-08 NOTE — Assessment & Plan Note (Signed)
Reports no recent seizures, none since her first stroke.  Currently no maintenance medications.  Have recommended referral back to neurology.  Will place this referral today and CCM team will work on determining assistance available to help with financial strain.

## 2018-09-09 ENCOUNTER — Other Ambulatory Visit: Payer: Self-pay | Admitting: Nurse Practitioner

## 2018-09-09 MED ORDER — CLOPIDOGREL BISULFATE 75 MG PO TABS: ORAL_TABLET | ORAL | 3 refills | Status: DC

## 2018-09-09 NOTE — Telephone Encounter (Signed)
Pt. Scheduled

## 2018-09-09 NOTE — Telephone Encounter (Signed)
Complete.  Can you also call patient and ensure she schedules follow-up for 2 weeks, she did not schedule yesterday.

## 2018-09-10 NOTE — Chronic Care Management (AMB) (Signed)
  Care Management   Initial Visit Note  09/10/2018 Name: Vicki Rosales MRN: [id number] DOB: 08/26/1955  Subjective:   Objective:  Assessment: Vicki Rosales is a 63 y.o. year old female who sees Marnee Guarneri T, NP for primary care. The care management team was consulted for assistance with care management and care coordination needs related to Disease Management Medication Management and Education Medication Reconciliation Medication Assistance .   Review of patient status, including review of consultants reports, relevant laboratory and other test results, and collaboration with appropriate care team members and the patient's provider was performed as part of comprehensive patient evaluation and provision of chronic care management services.    SDOH (Social Determinants of Health) screening performed today. See Care Plan Entry related to challenges with: Financial Strain  Stress  Goals Addressed            This Visit's Progress   . I need to decrease my migraines before returning to work (pt-stated)       Current Barriers:  Marland Kitchen Knowledge Deficits related to how to reduce occular migraines . Film/video editor.   Nurse Case Manager Clinical Goal(s):  Marland Kitchen Over the next 90 days, patient will work with [facility] to address needs related to reducing occular migraines  Interventions:  . Co-visit at PCP office with CCM Pharm-D . Discussed plans with patient for ongoing care management follow up and provided patient with direct contact information for care management team . Reviewed past medical history  . Reviewed migraine triggers and prevention  . Reviewed stressors   Patient Self Care Activities:  . Currently UNABLE TO independently control migraines causing stress related to patient returning to work 09/27/18 . Performs ADL's independently . Performs IADL's independently  Initial goal documentation         Follow up plan:  The care management team will reach out to  the patient again over the next 14 days.  The patient has been provided with contact information for the care management team and has been advised to call with any health related questions or concerns.   Ms. Vanderbeck was given information about Care Management services today including:  1. Care Management services include personalized support from designated clinical staff supervised by a physician, including individualized plan of care and coordination with other care providers 2. 24/7 contact phone numbers for assistance for urgent and routine care needs. 3. The patient may stop CCM services at any time (effective at the end of the month) by phone call to the office staff.  Patient agreed to services and verbal consent obtained.  Merlene Morse Samona Chihuahua RN, BSN Nurse Case Editor, [facility] Care Management  [phone]) Business Mobile

## 2018-09-11 NOTE — Patient Instructions (Signed)
Thank you allowing the Chronic Care Management Team to be a part of your care! It was a pleasure speaking with you today! CCM (Chronic Care Management) Team   Kendrea Cerritos RN, BSN Nurse Care Coordinator  [phone]  Catie [facility] PharmD  Clinical Pharmacist  [phone]  Eula Fried LCSW Clinical Social Worker [phone]  Goals Addressed            This Visit's Progress   . I need to decrease my migraines before returning to work (pt-stated)       Current Barriers:  Marland Kitchen Knowledge Deficits related to how to reduce occular migraines . Film/video editor.   Nurse Case Manager Clinical Goal(s):  Marland Kitchen Over the next 90 days, patient will work with [facility] to address needs related to reducing occular migraines  Interventions:  . Co-visit at PCP office with CCM Pharm-D . Discussed plans with patient for ongoing care management follow up and provided patient with direct contact information for care management team . Reviewed past medical history  . Reviewed migraine triggers and prevention  . Reviewed stressors   Patient Self Care Activities:  . Currently UNABLE TO independently control migraines causing stress related to patient returning to work 09/27/18 . Performs ADL's independently . Performs IADL's independently  Initial goal documentation        The patient verbalized understanding of instructions provided today and declined a print copy of patient instruction materials.   The patient has been provided with contact information for the care management team and has been advised to call with any health related questions or concerns.

## 2018-09-16 ENCOUNTER — Telehealth: Payer: Self-pay | Admitting: Pharmacy Technician

## 2018-09-16 ENCOUNTER — Ambulatory Visit: Payer: Self-pay | Admitting: *Deleted

## 2018-09-16 DIAGNOSIS — G43109 Migraine with aura, not intractable, without status migrainosus: Secondary | ICD-10-CM

## 2018-09-16 NOTE — Patient Instructions (Signed)
Thank you allowing the Chronic Care Management Team to be a part of your care! It was a pleasure speaking with you today!  CCM (Chronic Care Management) Team   Zacharias Ridling RN, BSN Nurse Care Coordinator  [phone]  Catie [facility] PharmD  Clinical Pharmacist  [phone]  Eula Fried LCSW Clinical Social Worker [phone]  Goals Addressed            This Visit's Progress   . I need to decrease my migraines before returning to work (pt-stated)       Current Barriers:  Marland Kitchen Knowledge Deficits related to how to reduce occular migraines . Film/video editor.   Nurse Case Manager Clinical Goal(s):  Marland Kitchen Over the next 90 days, patient will work with [facility] to address needs related to reducing occular migraines  Interventions:  . Co-visit at PCP office with CCM Pharm-D . Discussed plans with patient for ongoing care management follow up and provided patient with direct contact information for care management team . Reviewed past medical history  . Reviewed migraine triggers and prevention  . Reviewed stressors  . Followed up with patient to ensure she was able to obtain Neurology appointment . Patient had some medication questions, will message Catie Pharm- D to address these.   Patient Self Care Activities:  . Currently UNABLE TO independently control migraines causing stress related to patient returning to work 09/27/18 . Performs ADL's independently . Performs IADL's independently  Initial goal documentation        The patient verbalized understanding of instructions provided today and declined a print copy of patient instruction materials.   The patient has been provided with contact information for the care management team and has been advised to call with any health related questions or concerns.

## 2018-09-16 NOTE — Chronic Care Management (AMB) (Signed)
  Chronic Care Management   Follow Up Note   09/16/2018 Name: Vicki Rosales MRN: [id number] DOB: [birth year]  Referred by: Venita Lick, NP Reason for referral : Chronic Care Management (Occular Migraines, Hx CVA's)   [name] is a 63 y.o. year old female who is a primary care patient of Cannady, Barbaraann Faster, NP. The CCM team was consulted for assistance with chronic disease management and care coordination needs.    Review of patient status, including review of consultants reports, relevant laboratory and other test results, and collaboration with appropriate care team members and the patient's provider was performed as part of comprehensive patient evaluation and provision of chronic care management services.    Goals Addressed            This Visit's Progress   . I need to decrease my migraines before returning to work (pt-stated)       Current Barriers:  Marland Kitchen Knowledge Deficits related to how to reduce occular migraines . Film/video editor.   Nurse Case Manager Clinical Goal(s):  Marland Kitchen Over the next 90 days, patient will work with [facility] to address needs related to reducing occular migraines  Interventions:  . Co-visit at PCP office with CCM Pharm-D . Discussed plans with patient for ongoing care management follow up and provided patient with direct contact information for care management team . Reviewed past medical history  . Reviewed migraine triggers and prevention  . Reviewed stressors  . Followed up with patient to ensure she was able to obtain Neurology appointment . Patient had some medication questions, will message Catie Pharm- D to address these.   Patient Self Care Activities:  . Currently UNABLE TO independently control migraines causing stress related to patient returning to work 09/27/18 . Performs ADL's independently . Performs IADL's independently  Initial goal documentation         The care management team will reach out to the patient  again over the next 30 days.  The patient has been provided with contact information for the care management team and has been advised to call with any health related questions or concerns.    SIGNATURE

## 2018-09-16 NOTE — Telephone Encounter (Signed)
Provided patient with new patient packet to obtain [facility] services.  [facility] must receive 2019 financial documentation in order to determine eligibility.  Vicki Rosales J. Tiffannie Sloss Care Manager [facility] 

## 2018-09-17 ENCOUNTER — Telehealth: Payer: Self-pay

## 2018-09-27 ENCOUNTER — Ambulatory Visit: Payer: Self-pay | Admitting: Nurse Practitioner

## 2018-09-29 ENCOUNTER — Telehealth: Payer: Self-pay

## 2018-09-29 ENCOUNTER — Ambulatory Visit: Payer: Self-pay | Admitting: Pharmacist

## 2018-09-29 NOTE — Chronic Care Management (AMB) (Signed)
  Chronic Care Management   Note  09/29/2018 Name: Vicki Rosales MRN: [id number] DOB: 10-27-1955  Vicki Rosales is a 63 y.o. year old female who is a primary care patient of Cannady, Barbaraann Faster, NP. The CCM team was consulted for assistance with chronic disease management and care coordination needs.    Contacted patient to follow up on status of connecting with [facility]. Left HIPAA compliant message for patient to return my call at her convenience.   Follow up plan: - If I do not hear back, will outreach in another 3-4 weeks  Catie Darnelle Maffucci, PharmD Clinical Pharmacist [place] [phone]

## 2018-10-27 ENCOUNTER — Telehealth: Payer: Self-pay

## 2018-10-27 ENCOUNTER — Ambulatory Visit: Payer: Self-pay | Admitting: Pharmacist

## 2018-10-27 NOTE — Chronic Care Management (AMB) (Signed)
  Chronic Care Management   Note  10/27/2018 Name: KASHVI PREVETTE MRN: [id number] DOB: December 23, 1955  DANY HARTEN is a 63 y.o. year old female who is a primary care patient of Cannady, Barbaraann Faster, NP. The CCM team was consulted for assistance with chronic disease management and care coordination needs.    Attempted to contact patient to follow up on connecting with [facility]. Left HIPAA compliant message for patient to return my call at her convenience.   Follow up plan: - Will attempt outreach #3 in 2-3 weeks  Catie Darnelle Maffucci, PharmD Clinical Pharmacist [place] [phone]

## 2018-11-17 ENCOUNTER — Ambulatory Visit: Payer: Self-pay | Admitting: Pharmacist

## 2018-11-17 ENCOUNTER — Telehealth: Payer: Self-pay

## 2018-11-17 NOTE — Chronic Care Management (AMB) (Signed)
  Chronic Care Management   Note  11/17/2018 Name: KITTI MCCLISH MRN: [id number] DOB: 1955-05-14  GURSIMRAN LITAKER is a 63 y.o. year old female who is a primary care patient of Cannady, Barbaraann Faster, NP. The CCM team was consulted for assistance with chronic disease management and care coordination needs.    Third unsuccessful outreach attempt to patient. Left HIPAA compliant message requesting a call back.   CCM team would be happy to reengage with patient in the future, if patient reaches our we receive a new referral.   Catie Darnelle Maffucci, PharmD Clinical Pharmacist [place] [phone]

## 2019-03-01 ENCOUNTER — Other Ambulatory Visit: Payer: Self-pay

## 2019-03-01 DIAGNOSIS — Z20822 Contact with and (suspected) exposure to covid-19: Secondary | ICD-10-CM

## 2019-03-02 LAB — NOVEL CORONAVIRUS, NAA: SARS-CoV-2, NAA: NOT DETECTED

## 2019-04-05 ENCOUNTER — Other Ambulatory Visit: Payer: Self-pay | Admitting: Nurse Practitioner

## 2019-04-06 ENCOUNTER — Other Ambulatory Visit: Payer: Self-pay | Admitting: Nurse Practitioner

## 2019-04-07 ENCOUNTER — Other Ambulatory Visit: Payer: Self-pay | Admitting: Nurse Practitioner

## 2019-04-07 MED ORDER — SERTRALINE HCL 100 MG PO TABS: 100.0000 mg | ORAL_TABLET | Freq: Every day | ORAL | 3 refills | Status: DC

## 2019-04-07 NOTE — Telephone Encounter (Signed)
Pt needs a refill on sertraline . [facility]  [address] in [place]. Pt has an appt on 04/20/2019. Please call patient once rx has sent to pharm

## 2019-04-07 NOTE — Telephone Encounter (Signed)
Routing to provider  

## 2019-04-20 ENCOUNTER — Ambulatory Visit: Payer: Self-pay | Admitting: Nurse Practitioner

## 2019-10-15 ENCOUNTER — Other Ambulatory Visit: Payer: Self-pay | Admitting: Nurse Practitioner

## 2019-10-15 NOTE — Telephone Encounter (Signed)
Requested medication (s) are due for refill today: yes  Requested medication (s) are on the active medication list: yes  Last refill:  09/09/18  Future visit scheduled: no  Notes to clinic:  called pt and LM on VM to call office to make appt for med refill. 6 months overdue   Requested Prescriptions  Pending Prescriptions Disp Refills   clopidogrel (PLAVIX) 75 MG tablet [Pharmacy Med Name: Clopidogrel Bisulfate 75 MG Oral Tablet] 30 tablet 0    Sig: Take 1 tablet by mouth once daily      Hematology: Antiplatelets - clopidogrel Failed - 10/15/2019  2:25 PM      Failed - Evaluate AST, ALT within 2 months of therapy initiation.      Failed - ALT in normal range and within 360 days    ALT  Date Value Ref Range Status  01/01/2017 12 (L) 14 - 54 U/L Final   SGPT (ALT)  Date Value Ref Range Status  07/06/2012 15 12 - 78 U/L Final          Failed - AST in normal range and within 360 days    AST  Date Value Ref Range Status  01/01/2017 23 15 - 41 U/L Final   SGOT(AST)  Date Value Ref Range Status  07/06/2012 22 15 - 37 Unit/L Final          Failed - HCT in normal range and within 180 days    HCT  Date Value Ref Range Status  01/01/2017 48.1 (H) 35 - 47 % Final   Hematocrit  Date Value Ref Range Status  05/18/2015 43.6 34.0 - 46.6 % Final          Failed - HGB in normal range and within 180 days    Hemoglobin  Date Value Ref Range Status  01/01/2017 16.1 (H) 12.0 - 16.0 g/dL Final  63/03/6008 93.2 11.1 - 15.9 g/dL Final          Failed - PLT in normal range and within 180 days    Platelets  Date Value Ref Range Status  01/01/2017 223 150 - 440 K/uL Final  05/18/2015 246 150 - 379 x10E3/uL Final          Failed - Valid encounter within last 6 months    Recent Outpatient Visits           1 year ago Ocular migraine   [facility] [place], Corrie Dandy T, NP   1 year ago Acute maxillary sinusitis, recurrence not specified   [facility]  Particia [place], [place]   2 years ago Essential hypertension, benign   [facility] Gabriel Cirri, NP   3 years ago Essential hypertension, benign   [facility] Gabriel Cirri, NP   4 years ago Hyperlipidemia   [facility] [place], Janit Bern, MD

## 2019-10-17 NOTE — Telephone Encounter (Signed)
Ot has apt on 11/18/2019.Pt verbalized understanding.

## 2019-10-17 NOTE — Telephone Encounter (Signed)
See message below. Patient was contacted to schedule a follow up

## 2019-11-18 ENCOUNTER — Ambulatory Visit: Payer: Self-pay | Admitting: Nurse Practitioner

## 2019-11-18 ENCOUNTER — Other Ambulatory Visit: Payer: Self-pay | Admitting: Nurse Practitioner

## 2019-11-18 NOTE — Telephone Encounter (Signed)
Routing to provider. See message below.  

## 2019-11-18 NOTE — Telephone Encounter (Signed)
Requested medication (s) are due for refill today: no  Requested medication (s) are on the active medication list: yes  Last refill:  02/04/2017  Future visit scheduled: yes  Notes to clinic:  review for use    Requested Prescriptions  Pending Prescriptions Disp Refills   rOPINIRole (REQUIP) 2 MG tablet 90 tablet 3    Sig: Take 1 tablet (2 mg total) by mouth at bedtime.      Neurology:  Parkinsonian Agents Failed - 11/18/2019 12:35 PM      Failed - Valid encounter within last 12 months    Recent Outpatient Visits           1 year ago Ocular migraine   [facility] [place], Corrie Dandy T, NP   1 year ago Acute maxillary sinusitis, recurrence not specified   [facility] Particia Nearing, [place]   2 years ago Essential hypertension, benign   [facility] Gabriel Cirri, NP   3 years ago Essential hypertension, benign   [facility] Gabriel Cirri, NP   4 years ago Hyperlipidemia   [facility] [place], Janit Bern, MD       Future Appointments             In 1 week Cannady, Dorie Rank, NP [facility], [facility]            Passed - Last BP in normal range    BP Readings from Last 1 Encounters:  09/08/18 138/86

## 2019-11-18 NOTE — Telephone Encounter (Signed)
Medication Refill - Medication: Ropinirole  Has the patient contacted their pharmacy? No. (Agent: If no, request that the patient contact the pharmacy for the refill.) (Agent: If yes, when and what did the pharmacy advise?)  Preferred Pharmacy (with phone number or street name): [facility]   Agent: Please be advised that RX refills may take up to 3 business days. We ask that you follow-up with your pharmacy.

## 2019-11-18 NOTE — Telephone Encounter (Signed)
Per pec Pt made apt on 11/28/2019 9:40am

## 2019-11-18 NOTE — Telephone Encounter (Signed)
Noted, will refill at that visit.

## 2019-11-18 NOTE — Telephone Encounter (Signed)
Will need appointment for refills on this, have not seen in almost one year and this has not been filled since 2018.

## 2019-11-28 ENCOUNTER — Ambulatory Visit (INDEPENDENT_AMBULATORY_CARE_PROVIDER_SITE_OTHER): Payer: Self-pay | Admitting: Nurse Practitioner

## 2019-11-28 ENCOUNTER — Encounter: Payer: Self-pay | Admitting: Nurse Practitioner

## 2019-11-28 ENCOUNTER — Telehealth: Payer: Self-pay | Admitting: Nurse Practitioner

## 2019-11-28 ENCOUNTER — Other Ambulatory Visit: Payer: Self-pay

## 2019-11-28 ENCOUNTER — Other Ambulatory Visit: Payer: Self-pay | Admitting: Nurse Practitioner

## 2019-11-28 VITALS — BP 150/98 | HR 98 | Temp 98.2°F | Ht 61.0 in | Wt 138.0 lb

## 2019-11-28 DIAGNOSIS — L209 Atopic dermatitis, unspecified: Secondary | ICD-10-CM

## 2019-11-28 DIAGNOSIS — F330 Major depressive disorder, recurrent, mild: Secondary | ICD-10-CM

## 2019-11-28 DIAGNOSIS — F17210 Nicotine dependence, cigarettes, uncomplicated: Secondary | ICD-10-CM

## 2019-11-28 DIAGNOSIS — Z1231 Encounter for screening mammogram for malignant neoplasm of breast: Secondary | ICD-10-CM

## 2019-11-28 DIAGNOSIS — Z114 Encounter for screening for human immunodeficiency virus [HIV]: Secondary | ICD-10-CM

## 2019-11-28 DIAGNOSIS — Z1159 Encounter for screening for other viral diseases: Secondary | ICD-10-CM

## 2019-11-28 DIAGNOSIS — I422 Other hypertrophic cardiomyopathy: Secondary | ICD-10-CM

## 2019-11-28 DIAGNOSIS — F411 Generalized anxiety disorder: Secondary | ICD-10-CM

## 2019-11-28 DIAGNOSIS — I10 Essential (primary) hypertension: Secondary | ICD-10-CM

## 2019-11-28 DIAGNOSIS — Z1211 Encounter for screening for malignant neoplasm of colon: Secondary | ICD-10-CM

## 2019-11-28 DIAGNOSIS — E7800 Pure hypercholesterolemia, unspecified: Secondary | ICD-10-CM

## 2019-11-28 DIAGNOSIS — Z8673 Personal history of transient ischemic attack (TIA), and cerebral infarction without residual deficits: Secondary | ICD-10-CM

## 2019-11-28 DIAGNOSIS — G2581 Restless legs syndrome: Secondary | ICD-10-CM

## 2019-11-28 MED ORDER — ROSUVASTATIN CALCIUM 10 MG PO TABS: 10.0000 mg | ORAL_TABLET | Freq: Every day | ORAL | 3 refills | Status: DC

## 2019-11-28 MED ORDER — TAZAROTENE 0.05 % EX CREA: TOPICAL_CREAM | Freq: Every day | CUTANEOUS | 0 refills | Status: DC

## 2019-11-28 MED ORDER — COAL TAR EXTRACT 0.5 % EX SHAM: MEDICATED_SHAMPOO | Freq: Every evening | CUTANEOUS | 12 refills | Status: DC | PRN

## 2019-11-28 MED ORDER — SERTRALINE HCL 100 MG PO TABS: 100.0000 mg | ORAL_TABLET | Freq: Every day | ORAL | 4 refills | Status: DC

## 2019-11-28 MED ORDER — ROPINIROLE HCL 2 MG PO TABS: 2.0000 mg | ORAL_TABLET | Freq: Every day | ORAL | 3 refills | Status: DC

## 2019-11-28 MED ORDER — LOSARTAN POTASSIUM 25 MG PO TABS: 25.0000 mg | ORAL_TABLET | Freq: Every day | ORAL | 3 refills | Status: DC

## 2019-11-28 NOTE — Assessment & Plan Note (Signed)
Ongoing, stable.  Continue ASA daily and recommend she take statin as instructed.  Would benefit return to cardiology, declines at this time.  Recommend complete cessation of smoking. 

## 2019-11-28 NOTE — Assessment & Plan Note (Signed)
Chronic, stable.  Denies SI/HI.  Continue Sertraline daily and adjust dose as needed.  Refills sent in.  Recommend notify provider immediately if worsening mood. 

## 2019-11-28 NOTE — Assessment & Plan Note (Signed)
CVA's in 2007, 2009, 2014.   WNL neuro exam today.  Educated her at length of stroke prevention goals including LDL <70 and BP <130/80.  Reiterated importance of taking medication daily. 

## 2019-11-28 NOTE — Telephone Encounter (Signed)
Sent in a coal tar shampoo which may be cheaper.  Only apply cream to areas of crusting, only the patches, not whole scalp.

## 2019-11-28 NOTE — Assessment & Plan Note (Addendum)
Chronic, stable with Requip.  Refills sent in.  Will adjust dose as needed. 

## 2019-11-28 NOTE — Assessment & Plan Note (Signed)
Chronic, uncontrolled.  Will initiate Losartan 25 MG daily, script sent.  Educated on DASH diet and recommend she monitor BP at home daily + document. Recommend complete cessation of smoking.  Discussed with her goal BP of <130/80 for stroke prevention.  BMP today.  Return in 6 weeks for HTN.

## 2019-11-28 NOTE — Patient Instructions (Addendum)
Please call at this number [facility]) to schedule your mammogram. [phone]   DASH Eating Plan DASH stands for "Dietary Approaches to Stop Hypertension." The DASH eating plan is a healthy eating plan that has been shown to reduce high blood pressure (hypertension). It may also reduce your risk for type 2 diabetes, heart disease, and stroke. The DASH eating plan may also help with weight loss. What are tips for following this plan?  General guidelines  Avoid eating more than 2,300 mg (milligrams) of salt (sodium) a day. If you have hypertension, you may need to reduce your sodium intake to 1,500 mg a day.  Limit alcohol intake to no more than 1 drink a day for nonpregnant women and 2 drinks a day for men. One drink equals 12 oz of beer, 5 oz of wine, or 1 oz of hard liquor.  Work with your health care provider to maintain a healthy body weight or to lose weight. Ask what an ideal weight is for you.  Get at least 30 minutes of exercise that causes your heart to beat faster (aerobic exercise) most days of the week. Activities may include walking, swimming, or biking.  Work with your health care provider or diet and nutrition specialist (dietitian) to adjust your eating plan to your individual calorie needs. Reading food labels   Check food labels for the amount of sodium per serving. Choose foods with less than 5 percent of the Daily Value of sodium. Generally, foods with less than 300 mg of sodium per serving fit into this eating plan.  To find whole grains, look for the word "whole" as the first word in the ingredient list. Shopping  Buy products labeled as "low-sodium" or "no salt added."  Buy fresh foods. Avoid canned foods and premade or frozen meals. Cooking  Avoid adding salt when cooking. Use salt-free seasonings or herbs instead of table salt or sea salt. Check with your health care provider or pharmacist before using salt substitutes.  Do not fry foods.  Cook foods using healthy methods such as baking, boiling, grilling, and broiling instead.  Cook with heart-healthy oils, such as olive, canola, soybean, or sunflower oil. Meal planning  Eat a balanced diet that includes: ? 5 or more servings of fruits and vegetables each day. At each meal, try to fill half of your plate with fruits and vegetables. ? Up to 6-8 servings of whole grains each day. ? Less than 6 oz of lean meat, poultry, or fish each day. A 3-oz serving of meat is about the same size as a deck of cards. One egg equals 1 oz. ? 2 servings of low-fat dairy each day. ? A serving of nuts, seeds, or beans 5 times each week. ? Heart-healthy fats. Healthy fats called Omega-3 fatty acids are found in foods such as flaxseeds and coldwater fish, like sardines, salmon, and mackerel.  Limit how much you eat of the following: ? Canned or prepackaged foods. ? Food that is high in trans fat, such as fried foods. ? Food that is high in saturated fat, such as fatty meat. ? Sweets, desserts, sugary drinks, and other foods with added sugar. ? Full-fat dairy products.  Do not salt foods before eating.  Try to eat at least 2 vegetarian meals each week.  Eat more home-cooked food and less restaurant, buffet, and fast food.  When eating at a restaurant, ask that your food be prepared with less salt or no salt, if possible. What foods  are recommended? The items listed may not be a complete list. Talk with your dietitian about what dietary choices are best for you. Grains Whole-grain or whole-wheat bread. Whole-grain or whole-wheat pasta. Brown rice. [place]ena Morrow. Bulgur. Whole-grain and low-sodium cereals. Pita bread. Low-fat, low-sodium crackers. Whole-wheat flour tortillas. Vegetables Fresh or frozen vegetables (raw, steamed, roasted, or grilled). Low-sodium or reduced-sodium tomato and vegetable juice. Low-sodium or reduced-sodium tomato sauce and tomato paste. Low-sodium or reduced-sodium  canned vegetables. Fruits All fresh, dried, or frozen fruit. Canned fruit in natural juice (without added sugar). Meat and other protein foods Skinless chicken or [place]. Ground chicken or [place]. Pork with fat trimmed off. Fish and seafood. Egg whites. Dried beans, peas, or lentils. Unsalted nuts, nut butters, and seeds. Unsalted canned beans. Lean cuts of beef with fat trimmed off. Low-sodium, lean deli meat. Dairy Low-fat (1%) or fat-free (skim) milk. Fat-free, low-fat, or reduced-fat cheeses. Nonfat, low-sodium ricotta or cottage cheese. Low-fat or nonfat yogurt. Low-fat, low-sodium cheese. Fats and oils Soft margarine without trans fats. Vegetable oil. Low-fat, reduced-fat, or light mayonnaise and salad dressings (reduced-sodium). Canola, safflower, olive, soybean, and sunflower oils. Avocado. Seasoning and other foods Herbs. Spices. Seasoning mixes without salt. Unsalted popcorn and pretzels. Fat-free sweets. What foods are not recommended? The items listed may not be a complete list. Talk with your dietitian about what dietary choices are best for you. Grains Baked goods made with fat, such as croissants, muffins, or some breads. Dry pasta or rice meal packs. Vegetables Creamed or fried vegetables. Vegetables in a cheese sauce. Regular canned vegetables (not low-sodium or reduced-sodium). Regular canned tomato sauce and paste (not low-sodium or reduced-sodium). Regular tomato and vegetable juice (not low-sodium or reduced-sodium). Angie Fava. Olives. Fruits Canned fruit in a light or heavy syrup. Fried fruit. Fruit in cream or butter sauce. Meat and other protein foods Fatty cuts of meat. Ribs. Fried meat. Berniece Salines. Sausage. Bologna and other processed lunch meats. Salami. Fatback. Hotdogs. Bratwurst. Salted nuts and seeds. Canned beans with added salt. Canned or smoked fish. Whole eggs or egg yolks. Chicken or [place] with skin. Dairy Whole or 2% milk, cream, and half-and-half. Whole or  full-fat cream cheese. Whole-fat or sweetened yogurt. Full-fat cheese. Nondairy creamers. Whipped toppings. Processed cheese and cheese spreads. Fats and oils Butter. Stick margarine. Lard. Shortening. Ghee. Bacon fat. Tropical oils, such as coconut, palm kernel, or palm oil. Seasoning and other foods Salted popcorn and pretzels. Onion salt, garlic salt, seasoned salt, table salt, and sea salt. Worcestershire sauce. Tartar sauce. Barbecue sauce. Teriyaki sauce. Soy sauce, including reduced-sodium. Steak sauce. Canned and packaged gravies. Fish sauce. Oyster sauce. Cocktail sauce. Horseradish that you find on the shelf. Ketchup. Mustard. Meat flavorings and tenderizers. Bouillon cubes. Hot sauce and Tabasco sauce. Premade or packaged marinades. Premade or packaged taco seasonings. Relishes. Regular salad dressings. Where to find more information:  National Heart, Lung, and [place]: https://wilson-eaton.com/  American Heart Association: www.heart.org Summary  The DASH eating plan is a healthy eating plan that has been shown to reduce high blood pressure (hypertension). It may also reduce your risk for type 2 diabetes, heart disease, and stroke.  With the DASH eating plan, you should limit salt (sodium) intake to 2,300 mg a day. If you have hypertension, you may need to reduce your sodium intake to 1,500 mg a day.  When on the DASH eating plan, aim to eat more fresh fruits and vegetables, whole grains, lean proteins, low-fat dairy, and heart-healthy fats.  Work with your  health care provider or diet and nutrition specialist (dietitian) to adjust your eating plan to your individual calorie needs. This information is not intended to replace advice given to you by your health care provider. Make sure you discuss any questions you have with your health care provider. Document Revised: 02/27/2017 Document Reviewed: 03/10/2016 Elsevier Patient Education  2020 [employer].

## 2019-11-28 NOTE — Telephone Encounter (Signed)
Patient notified of Vicki Rosales's message.   °

## 2019-11-28 NOTE — Progress Notes (Signed)
BP (!) 150/98 (BP Location: Left Arm)   Pulse 98   Temp 98.2 F (36.8 C) (Oral)   Ht 5\' 1"  (1.549 m)   Wt 138 lb (62.6 kg)   SpO2 97%   BMI 26.07 kg/m    Subjective:    Patient ID: , female    DOB: Jul 15, 1955, 64 y.o.   MRN: [id number]  HPI: Vicki Rosales is a 64 y.o. female  Chief Complaint  Patient presents with  . Anxiety  . Hyperlipidemia    pt states has stopped taking pravastatin over a year ago due to side effects sore muscles  . RLS   ANXIETY/STRESS Continues on Sertraline 100 MG daily. Duration:stable Anxious mood: no  Excessive worrying: no Irritability: no  Sweating: no Nausea: no Palpitations:no Hyperventilation: no Panic attacks: no Agoraphobia: no  Obscessions/compulsions: no Depressed mood: no Depression screen [facility] 2/9 11/28/2019 04/14/2017 01/15/2016 10/27/2014  Decreased Interest 0 0 0 0  Down, Depressed, Hopeless 0 0 0 0  PHQ - 2 Score 0 0 0 0  Altered sleeping 0 0 0 -  Tired, decreased energy 0 0 0 -  Change in appetite 0 0 0 -  Feeling bad or failure about yourself  0 0 0 -  Trouble concentrating 0 0 0 -  Moving slowly or fidgety/restless 0 0 0 -  Suicidal thoughts 0 0 0 -  PHQ-9 Score 0 0 0 -   Anhedonia: no Weight changes: no Insomnia: none Hypersomnia: no Fatigue/loss of energy: no Feelings of worthlessness: no Feelings of guilt: no Impaired concentration/indecisiveness: no Suicidal ideations: no  Crying spells: no Recent Stressors/Life Changes: no   Relationship problems: no   Family stress: no     Financial stress: no    Job stress: no    Recent death/loss: no  HYPERLIPIDEMIA Has history of CVA 2007, 2009, 2014 and reports stopping taking Pravastatin over a year ago due to muscle pains -- this is only statin she has taken.  Also have underlying hypertrophic cardiomyopathy.  Has not seen cardiology since 2014 and was to see neuro in 2019, but never attended.  Continues on Plavix and ASA  daily. Hyperlipidemia status: poor compliance Satisfied with current treatment?  no Side effects:  yes Medication compliance: poor compliance Past cholesterol meds: Pravastatin Supplements: none Aspirin:  no The ASCVD Risk score 2020 DC Jr., et al., 2013) failed to calculate for the following reasons:   The patient has a prior MI or stroke diagnosis Chest pain:  no Coronary artery disease:  no Family history CAD:  no Family history early CAD:  no   HYPERTENSION No current BP medications, has been prescribed medication in past, but did not take.  She is a current smoker, smokes about 3/4 PPD -- has smoked since her teen years -- quit for awhile.   Hypertension status: uncontrolled  Satisfied with current treatment? none Duration of hypertension: chronic BP monitoring frequency:  not checking BP range:  BP medication side effects:  no Medication compliance: poor compliance Previous BP meds: does not recall Aspirin: no Recurrent headaches: no Visual changes: no Palpitations: no Dyspnea: no Chest pain: no Lower extremity edema: no Dizzy/lightheaded: no  RESTLESS LEGS Continues on Ropinirole 2 MG QHS. Duration: chronic Discomfort description: crawling Pain: no Location: lower legs Bilateral: yes Symmetric: yes Severity: moderate Onset:  gradual Frequency:  intermittent Symptoms only occur while legs at rest: yes Sudden unintentional leg jerking: no Bed partner bothered by leg movements: no LE  numbness: no Decreased sensation: no Weakness: no Insomnia: no Daytime somnolence: no Fatigue: no Alleviating factors: Requip Aggravating factors: rest Status: stable Treatments attempted: Requip  SCALP DRY SKIN: Has been present for a year, scratching it a lot.  Reports some flaky, hard areas.  Is itchy, only to scalp and no where else.  Has tried OTC shampoos without success.  Relevant past medical, surgical, family and social history reviewed and updated as indicated.  Interim medical history since our last visit reviewed. Allergies and medications reviewed and updated.  Review of Systems  Constitutional: Negative for activity change, appetite change, diaphoresis, fatigue and fever.  Respiratory: Negative for cough, chest tightness and shortness of breath.   Cardiovascular: Negative for chest pain, palpitations and leg swelling.  Gastrointestinal: Negative.   Neurological: Negative.   Psychiatric/Behavioral: Negative.     Per HPI unless specifically indicated above     Objective:    BP (!) 150/98 (BP Location: Left Arm)   Pulse 98   Temp 98.2 F (36.8 C) (Oral)   Ht 5\' 1"  (1.549 m)   Wt 138 lb (62.6 kg)   SpO2 97%   BMI 26.07 kg/m   Wt Readings from Last 3 Encounters:  11/28/19 138 lb (62.6 kg)  05/14/18 130 lb 6.4 oz (59.1 kg)  05/05/17 139 lb (63 kg)    Physical Exam Vitals and nursing note reviewed.  Constitutional:      General: She is awake. She is not in acute distress.    Appearance: She is well-developed, well-groomed and overweight. She is not ill-appearing.  HENT:     Head: Normocephalic.     Right Ear: Hearing normal.     Left Ear: Hearing normal.  Eyes:     General: Lids are normal.        Right eye: No discharge.        Left eye: No discharge.     Conjunctiva/sclera: Conjunctivae normal.     Pupils: Pupils are equal, round, and reactive to light.  Neck:     Thyroid: No thyromegaly.     Vascular: No carotid bruit.  Cardiovascular:     Rate and Rhythm: Normal rate and regular rhythm.     Heart sounds: Normal heart sounds. No murmur heard.  No gallop.   Pulmonary:     Effort: Pulmonary effort is normal. No accessory muscle usage or respiratory distress.     Breath sounds: Normal breath sounds.  Abdominal:     General: Bowel sounds are normal.     Palpations: Abdomen is soft.  Musculoskeletal:     Cervical back: Normal range of motion and neck supple.     Right lower leg: No edema.     Left lower leg: No edema.   Skin:    General: Skin is warm and dry.     Findings: Rash present. Rash is scaling.     Comments: Small, round, patchy areas of scaling to scalp noted with erythremic base.  Skin intact with no drainage.   Neurological:     Mental Status: She is alert and oriented to person, place, and time.  Psychiatric:        Attention and Perception: Attention normal.        Mood and Affect: Mood normal.        Speech: Speech normal.        Behavior: Behavior normal. Behavior is cooperative.        Thought Content: Thought content normal.  Results for orders placed or performed in visit on 03/01/19  Novel Coronavirus, NAA ([facility])   Specimen: Nasopharyngeal(NP) swabs in vial transport medium   NASOPHARYNGE  TESTING  Result Value Ref Range   SARS-CoV-2, NAA Not Detected Not Detected      Assessment & Plan:   Problem List Items Addressed This Visit      Cardiovascular and Mediastinum   Other hypertrophic cardiomyopathy (HCC) - Primary    Ongoing, stable.  Continue ASA daily and recommend she take statin as instructed.  Would benefit return to cardiology, declines at this time.  Recommend complete cessation of smoking.      Relevant Medications   rosuvastatin (CRESTOR) 10 MG tablet   losartan (COZAAR) 25 MG tablet   Essential hypertension, benign    Chronic, uncontrolled.  Will initiate Losartan 25 MG daily, script sent.  Educated on DASH diet and recommend she monitor BP at home daily + document. Recommend complete cessation of smoking.  Discussed with her goal BP of <130/80 for stroke prevention.  BMP today.  Return in 6 weeks for HTN.       Relevant Medications   rosuvastatin (CRESTOR) 10 MG tablet   losartan (COZAAR) 25 MG tablet   Other Relevant Orders   Basic metabolic panel     Other   History of CVA (cerebrovascular accident)    CVA's in 2007, 2009, 2014.   WNL neuro exam today.  Educated her at length of stroke prevention goals including LDL <70 and BP <130/80.   Reiterated importance of taking medication daily.      Hyperlipidemia    Chronic, in history of stroke.  Restart statin for stroke prevention, will trial Crestor daily.  Script sent. Lipid panel today.  Return in 6 weeks.      Relevant Medications   rosuvastatin (CRESTOR) 10 MG tablet   losartan (COZAAR) 25 MG tablet   Other Relevant Orders   Lipid Panel w/o Chol/HDL Ratio   Nicotine dependence, cigarettes, uncomplicated    I have recommended complete cessation of tobacco use. I have discussed various options available for assistance with tobacco cessation including over the counter methods (Nicotine gum, patch and lozenges). We also discussed prescription options (Chantix, Nicotine Inhaler / Nasal Spray). The patient is not interested in pursuing any prescription tobacco cessation options at this time.  Refuses lung CT screening due to out of pocket cost.      RLS (restless legs syndrome)    Chronic, stable with Requip.  Refills sent in.  Will adjust dose as needed.      Depression    Chronic, stable.  Denies SI/HI.  Continue Sertraline daily and adjust dose as needed.  Refills sent in.  Recommend notify provider immediately if worsening mood.      Relevant Medications   sertraline (ZOLOFT) 100 MG tablet   Generalized anxiety disorder    Chronic, stable.  Denies SI/HI.  Continue Sertraline daily and adjust dose as needed.  Refills sent in.  Recommend notify provider immediately if worsening mood.      Relevant Medications   sertraline (ZOLOFT) 100 MG tablet    Other Visit Diagnoses    Atopic dermatitis, unspecified type       New diagnosis, script sent for Tazorec.  Return for worsening, ongoing.   Encounter for screening mammogram for malignant neoplasm of breast       Mammogram ordered   Relevant Orders   MM DIGITAL SCREENING BILATERAL   Need for hepatitis C screening  test       Hep C on labs   Relevant Orders   Hepatitis C antibody   Encounter for screening for HIV        HIV screening   Relevant Orders   HIV Antibody (routine testing w rflx)   Colon cancer screening       GI referral   Relevant Orders   Ambulatory referral to Gastroenterology       Follow up plan: Return in about 6 weeks (around 01/09/2020) for HTN and HLD.

## 2019-11-28 NOTE — Telephone Encounter (Signed)
Called pt to see what questions she had. Pt states that she doesn't have insurance and the shampoo she was prescribed is too high, she wants to know if there is an alternative. Pt also wants to know if she should apply the hydrocortisone to her entire scalp. Please advise.   Copied from CRM [id number]. Topic: General - Other >> Nov 28, 2019 11:21 AM Dalphine Handing A wrote: Patient was seen today and forgotto ask question and would like a callback from [facility]

## 2019-11-28 NOTE — Assessment & Plan Note (Signed)
Chronic, in history of stroke.  Restart statin for stroke prevention, will trial Crestor daily.  Script sent. Lipid panel today.  Return in 6 weeks.

## 2019-11-28 NOTE — Assessment & Plan Note (Signed)
I have recommended complete cessation of tobacco use. I have discussed various options available for assistance with tobacco cessation including over the counter methods (Nicotine gum, patch and lozenges). We also discussed prescription options (Chantix, Nicotine Inhaler / Nasal Spray). The patient is not interested in pursuing any prescription tobacco cessation options at this time.  Refuses lung CT screening due to out of pocket cost.

## 2019-11-29 LAB — LIPID PANEL W/O CHOL/HDL RATIO
Cholesterol, Total: 286 mg/dL — ABNORMAL HIGH (ref 100–199)
HDL: 48 mg/dL (ref >39)
LDL Chol Calc (NIH): 198 mg/dL — ABNORMAL HIGH (ref 0–99)
Triglycerides: 209 mg/dL — ABNORMAL HIGH (ref 0–149)
VLDL Cholesterol Cal: 40 mg/dL (ref 5–40)

## 2019-11-29 LAB — BASIC METABOLIC PANEL
BUN/Creatinine Ratio: 16 (ref 12–28)
BUN: 15 mg/dL (ref 8–27)
CO2: 25 mmol/L (ref 20–29)
Calcium: 9.7 mg/dL (ref 8.7–10.3)
Chloride: 100 mmol/L (ref 96–106)
Creatinine, Ser: 0.92 mg/dL (ref 0.57–1.00)
GFR calc Af Amer: 77 mL/min/{1.73_m2} (ref >59)
GFR calc non Af Amer: 66 mL/min/{1.73_m2} (ref >59)
Glucose: 89 mg/dL (ref 65–99)
Potassium: 4.2 mmol/L (ref 3.5–5.2)
Sodium: 139 mmol/L (ref 134–144)

## 2019-11-29 LAB — HEPATITIS C ANTIBODY: Hep C Virus Ab: <0.1 s/co ratio (ref 0.0–0.9)

## 2019-11-29 LAB — HIV ANTIBODY (ROUTINE TESTING W REFLEX): HIV Screen 4th Generation wRfx: NONREACTIVE

## 2019-11-29 NOTE — Progress Notes (Signed)
Contacted via MyChart  Good morning Vicki Rosales, your labs have returned and overall look good with exception of cholesterol levels which are elevated as expected.  I do recommend starting and taking the Rosuvastatin consistently and we will recheck next visit.  Goal for stroke prevention is to get that LDL less than 70, yours is 198.  We have some work to do.:) Kidney function is normal this check.  Any questions? Keep being awesome!!  Thank you for allowing me to participate in your care. Kindest regards, Juanell Saffo

## 2019-12-07 ENCOUNTER — Encounter: Payer: Self-pay | Admitting: *Deleted

## 2019-12-14 ENCOUNTER — Other Ambulatory Visit: Payer: Self-pay

## 2019-12-14 DIAGNOSIS — Z20822 Contact with and (suspected) exposure to covid-19: Secondary | ICD-10-CM

## 2019-12-15 LAB — NOVEL CORONAVIRUS, NAA: SARS-CoV-2, NAA: NOT DETECTED

## 2019-12-15 LAB — SARS-COV-2, NAA 2 DAY TAT

## 2020-01-13 ENCOUNTER — Ambulatory Visit: Payer: Self-pay | Admitting: Nurse Practitioner

## 2020-01-13 ENCOUNTER — Other Ambulatory Visit: Payer: Self-pay

## 2020-03-16 ENCOUNTER — Telehealth: Payer: Self-pay

## 2020-03-16 ENCOUNTER — Other Ambulatory Visit: Payer: Self-pay | Admitting: Nurse Practitioner

## 2020-03-16 NOTE — Telephone Encounter (Signed)
Called and spoke with pharmacy, they will have the prescription ready in an hour. Called and left a VM letting patient know if will be ready in an hour.

## 2020-03-16 NOTE — Telephone Encounter (Signed)
Copied from CRM [id number]. Topic: General - Other >> Mar 16, 2020  9:55 AM Dalphine Handing A wrote: Patient would like a callback once office has made contact with her pharmacy in regards to her  sertraline (ZOLOFT) 100 MG tablet [Pharmacy Med Name: Sertraline HCl 100 MG Oral Tablet] medication. Patients pharmacy is stating that she has no refills on file, however 4 refill were sent in back in August 2021. Please advise . Patient is completely out of medication.

## 2020-05-11 ENCOUNTER — Telehealth: Payer: Self-pay

## 2020-05-11 NOTE — Telephone Encounter (Signed)
Please alert her I would like her to schedule appointment to discuss and we can see how I can assist her.  Thank you.  I would be glad to help however I can.

## 2020-05-11 NOTE — Telephone Encounter (Signed)
Pt last seen in august and scheduled for 3/4 called pt to get general information as to what is going on. Pt mentioned that she is having family issues and anxiety. Offered to schedule pt and appt sooner. Pt didn't want to do that she would like Jolene to call her. Advised that I would send a message but she may need to schedule appt. Please advise.   Copied from CRM [id number]. Topic: General - Other >> May 11, 2020  3:44 PM Mcneil, Ja-Kwan wrote: Reason for CRM: Pt stated she has a question that she would like to discuss with Jolene. Pt requests call back

## 2020-05-11 NOTE — Telephone Encounter (Signed)
Called pt back and advised her of Jolene's message. She states that she will keep the 3/4 appt. Advised to her that if she feels as if she needs to be seen prior to to contact us back. Also advised since self pay we could work with her about exploring options so that she could still be seen.

## 2020-06-01 ENCOUNTER — Ambulatory Visit: Payer: Self-pay | Admitting: Nurse Practitioner

## 2020-09-19 ENCOUNTER — Other Ambulatory Visit: Payer: Self-pay | Admitting: Nurse Practitioner

## 2020-09-19 NOTE — Telephone Encounter (Signed)
Medication Refill - Medication: losartan (COZAAR) 25 MG tablet  Has the patient contacted their pharmacy? No. Pt states she does have an appt on 06/24, and was completely out of the medication and wanted to get a refill to hold her until the appt.   Preferred Pharmacy (with phone number or street name): [facility] [address] [place], [place] - [address]  Phone:  [phone] Fax:  [phone]  Agent: Please be advised that RX refills may take up to 3 business days. We ask that you follow-up with your pharmacy.

## 2020-09-19 NOTE — Telephone Encounter (Signed)
Pt has an appt scheduled 6/24 

## 2020-09-19 NOTE — Telephone Encounter (Signed)
  Notes to clinic: Patient has appt on 09/21/2020 Review for refill   Requested Prescriptions  Pending Prescriptions Disp Refills   losartan (COZAAR) 25 MG tablet [Pharmacy Med Name: Losartan Potassium 25 MG Oral Tablet] 90 tablet 0    Sig: Take 1 tablet by mouth once daily      Cardiovascular:  Angiotensin Receptor Blockers Failed - 09/19/2020  2:23 PM      Failed - Cr in normal range and within 180 days    Creatinine  Date Value Ref Range Status  07/06/2012 0.87 0.60 - 1.30 mg/dL Final   Creatinine, Ser  Date Value Ref Range Status  11/28/2019 0.92 0.57 - 1.00 mg/dL Final          Failed - K in normal range and within 180 days    Potassium  Date Value Ref Range Status  11/28/2019 4.2 3.5 - 5.2 mmol/L Final  07/06/2012 3.8 3.5 - 5.1 mmol/L Final          Failed - Last BP in normal range    BP Readings from Last 1 Encounters:  11/28/19 (!) 150/98          Failed - Valid encounter within last 6 months    Recent Outpatient Visits           9 months ago Other hypertrophic cardiomyopathy (HCC)   [facility] [place], Corrie Dandy T, NP   2 years ago Ocular migraine   [facility] Lyman, Poynette T, NP   2 years ago Acute maxillary sinusitis, recurrence not specified   [facility] Particia Nearing, [place]   3 years ago Essential hypertension, benign   [facility] Gabriel Cirri, NP   4 years ago Essential hypertension, benign   [facility] Gabriel Cirri, NP       Future Appointments             In 2 days Cannady, Dorie Rank, NP [employer] [facility], [facility]             Passed - Patient is not pregnant

## 2020-09-19 NOTE — Telephone Encounter (Signed)
Pt has an appt scheduled 6/24

## 2020-09-19 NOTE — Telephone Encounter (Signed)
  Notes to clinic: Patient has appt on 09/21/2020  Patient is completely out of medication  Would like a refill   Requested Prescriptions  Pending Prescriptions Disp Refills   losartan (COZAAR) 25 MG tablet 90 tablet 3    Sig: Take 1 tablet (25 mg total) by mouth daily.      Cardiovascular:  Angiotensin Receptor Blockers Failed - 09/19/2020  8:19 AM      Failed - Cr in normal range and within 180 days    Creatinine  Date Value Ref Range Status  07/06/2012 0.87 0.60 - 1.30 mg/dL Final   Creatinine, Ser  Date Value Ref Range Status  11/28/2019 0.92 0.57 - 1.00 mg/dL Final          Failed - K in normal range and within 180 days    Potassium  Date Value Ref Range Status  11/28/2019 4.2 3.5 - 5.2 mmol/L Final  07/06/2012 3.8 3.5 - 5.1 mmol/L Final          Failed - Last BP in normal range    BP Readings from Last 1 Encounters:  11/28/19 (!) 150/98          Failed - Valid encounter within last 6 months    Recent Outpatient Visits           9 months ago Other hypertrophic cardiomyopathy (HCC)   [facility] [place], Corrie Dandy T, NP   2 years ago Ocular migraine   [facility] [place], [place] T, NP   2 years ago Acute maxillary sinusitis, recurrence not specified   [facility] Particia Nearing, [place]   3 years ago Essential hypertension, benign   [facility] Gabriel Cirri, NP   4 years ago Essential hypertension, benign   [facility] Gabriel Cirri, NP       Future Appointments             In 2 days Cann[place], Dorie Rank, NP [employer] [facility], [facility]             Passed - Patient is not pregnant

## 2020-09-20 ENCOUNTER — Other Ambulatory Visit: Payer: Self-pay | Admitting: Nurse Practitioner

## 2020-09-20 NOTE — Telephone Encounter (Signed)
Requested medication (s) are due for refill today: no valid encounter with in 3 months of protocol  Requested medication (s) are on the active medication list: yes   Last refill:  11/28/19 #90 3 refills   Future visit scheduled: yes tomorrow   Notes to clinic:  greater than 3 months overdue visit per protocol.      Requested Prescriptions  Pending Prescriptions Disp Refills   losartan (COZAAR) 25 MG tablet [Pharmacy Med Name: Losartan Potassium 25 MG Oral Tablet] 90 tablet 0    Sig: Take 1 tablet by mouth once daily      Cardiovascular:  Angiotensin Receptor Blockers Failed - 09/20/2020  6:31 PM      Failed - Cr in normal range and within 180 days    Creatinine  Date Value Ref Range Status  07/06/2012 0.87 0.60 - 1.30 mg/dL Final   Creatinine, Ser  Date Value Ref Range Status  11/28/2019 0.92 0.57 - 1.00 mg/dL Final          Failed - K in normal range and within 180 days    Potassium  Date Value Ref Range Status  11/28/2019 4.2 3.5 - 5.2 mmol/L Final  07/06/2012 3.8 3.5 - 5.1 mmol/L Final          Failed - Last BP in normal range    BP Readings from Last 1 Encounters:  11/28/19 (!) 150/98          Failed - Valid encounter within last 6 months    Recent Outpatient Visits           9 months ago Other hypertrophic cardiomyopathy (HCC)   [facility] [place], Corrie Dandy T, NP   2 years ago Ocular migraine   [facility] [place], [place] T, NP   2 years ago Acute maxillary sinusitis, recurrence not specified   [facility] Particia Nearing, [place]   3 years ago Essential hypertension, benign   [facility] Gabriel Cirri, NP   4 years ago Essential hypertension, benign   [facility] Gabriel Cirri, NP       Future Appointments             Tomorrow [place], Dorie Rank, NP [facility], [facility]             Passed - Patient is not pregnant

## 2020-09-21 ENCOUNTER — Other Ambulatory Visit: Payer: Self-pay

## 2020-09-21 ENCOUNTER — Ambulatory Visit (INDEPENDENT_AMBULATORY_CARE_PROVIDER_SITE_OTHER): Payer: Self-pay | Admitting: Nurse Practitioner

## 2020-09-21 ENCOUNTER — Encounter: Payer: Self-pay | Admitting: Nurse Practitioner

## 2020-09-21 VITALS — BP 125/81 | HR 82 | Temp 97.9°F | Wt 143.2 lb

## 2020-09-21 DIAGNOSIS — I422 Other hypertrophic cardiomyopathy: Secondary | ICD-10-CM

## 2020-09-21 DIAGNOSIS — F411 Generalized anxiety disorder: Secondary | ICD-10-CM

## 2020-09-21 DIAGNOSIS — Z8673 Personal history of transient ischemic attack (TIA), and cerebral infarction without residual deficits: Secondary | ICD-10-CM

## 2020-09-21 DIAGNOSIS — I10 Essential (primary) hypertension: Secondary | ICD-10-CM

## 2020-09-21 DIAGNOSIS — F330 Major depressive disorder, recurrent, mild: Secondary | ICD-10-CM

## 2020-09-21 DIAGNOSIS — G2581 Restless legs syndrome: Secondary | ICD-10-CM

## 2020-09-21 DIAGNOSIS — E7800 Pure hypercholesterolemia, unspecified: Secondary | ICD-10-CM

## 2020-09-21 DIAGNOSIS — F17210 Nicotine dependence, cigarettes, uncomplicated: Secondary | ICD-10-CM

## 2020-09-21 MED ORDER — SERTRALINE HCL 100 MG PO TABS: 100.0000 mg | ORAL_TABLET | Freq: Every day | ORAL | 4 refills | Status: DC

## 2020-09-21 MED ORDER — ROPINIROLE HCL 2 MG PO TABS: 2.0000 mg | ORAL_TABLET | Freq: Every day | ORAL | 4 refills | Status: DC

## 2020-09-21 MED ORDER — ROSUVASTATIN CALCIUM 10 MG PO TABS: 10.0000 mg | ORAL_TABLET | Freq: Every day | ORAL | 4 refills | Status: DC

## 2020-09-21 MED ORDER — CLOPIDOGREL BISULFATE 75 MG PO TABS: ORAL_TABLET | ORAL | 12 refills | Status: DC

## 2020-09-21 MED ORDER — LOSARTAN POTASSIUM 25 MG PO TABS: 25.0000 mg | ORAL_TABLET | Freq: Every day | ORAL | 4 refills | Status: DC

## 2020-09-21 NOTE — Assessment & Plan Note (Signed)
Chronic, stable.  BP at goal in office today.  Recommend she monitor BP at least a few mornings a week at home and document.  DASH diet at home.  Continue current medication regimen and adjust as needed.  Labs today: CBC, CMP, TSH.  Return annually, self pay will try to minimize visits to assist patient with needs.  Refills sent in.

## 2020-09-21 NOTE — Assessment & Plan Note (Signed)
I have recommended complete cessation of tobacco use. I have discussed various options available for assistance with tobacco cessation including over the counter methods (Nicotine gum, patch and lozenges). We also discussed prescription options (Chantix, Nicotine Inhaler / Nasal Spray). The patient is not interested in pursuing any prescription tobacco cessation options at this time.  

## 2020-09-21 NOTE — Assessment & Plan Note (Signed)
Ongoing, stable.  Continue ASA daily and recommend she take statin as instructed.  Would benefit return to cardiology, declines at this time.  Recommend complete cessation of smoking. 

## 2020-09-21 NOTE — Progress Notes (Signed)
BP 125/81   Pulse 82   Temp 97.9 F (36.6 C) (Oral)   Wt 143 lb 3.2 oz (65 kg)   SpO2 97%   BMI 27.06 kg/m    Subjective:    Patient ID: Vicki Rosales, female    DOB: [birth year], 65 y.o.   MRN: [id number]  HPI: Vicki Rosales is a 65 y.o. female  Chief Complaint  Patient presents with   Medication Refill    Patient is here requesting refills on her medications.    HYPERLIPIDEMIA Has history of CVA's 2007, 2009, 2014. Also have underlying hypertrophic cardiomyopathy.  Taking Rosuvastatin 10 MG daily + Plavix 75 MG and Losartan 25 MG.   Hyperlipidemia status: poor compliance Satisfied with current treatment?  no Side effects:  yes Medication compliance: average compliance Past cholesterol meds: Pravastatin Supplements: none Aspirin:  no The 10-year ASCVD risk score Denman George DC Jr., et al., 2013) is: 15.4%   Values used to calculate the score:     Age: 45 years     Sex: Female     Is Non-Hispanic African American: No     Diabetic: No     Tobacco smoker: Yes     Systolic Blood Pressure: 125 mmHg     Is BP treated: Yes     HDL Cholesterol: 48 mg/dL     Total Cholesterol: 286 mg/dL Chest pain:  no Coronary artery disease:  no Family history CAD:  no Family history early CAD:  no    HYPERTENSION Continues on Losartan 25 MG.  She is a current smoker, smokes about 3/4 PPD -- has smoked since her teen years -- did try quitting for awhile.   Hypertension status: stable Satisfied with current treatment? none Duration of hypertension: chronic BP monitoring frequency:  not checking BP range: BP medication side effects:  no Medication compliance: average compliance Previous BP meds: does not recall Aspirin: yes Recurrent headaches: no Visual changes: no Palpitations: no Dyspnea: no Chest pain: no Lower extremity edema: no Dizzy/lightheaded: no   RESTLESS LEGS Continues on Ropinirole 2 MG QHS.  Reports this does offer benefit. Duration: chronic Discomfort  description: crawling Pain: no Location: lower legs Bilateral: yes Symmetric: yes Severity: moderate Onset:  gradual Frequency:  intermittent Symptoms only occur while legs at rest: yes Sudden unintentional leg jerking: no Bed partner bothered by leg movements: no LE numbness: no Decreased sensation: no Weakness: no Insomnia: no Daytime somnolence: no Fatigue: no Alleviating factors: Requip Aggravating factors: rest Status: stable Treatments attempted: Requip  ANXIETY/STRESS Continues on Sertraline 100 MG daily. Duration:stable Anxious mood: no  Excessive worrying: no Irritability: no  Sweating: no Nausea: no Palpitations:no Hyperventilation: no Panic attacks: no Agoraphobia: no  Obscessions/compulsions: no Depressed mood: no Depression screen [facility] 2/9 09/21/2020 11/28/2019 04/14/2017 01/15/2016 10/27/2014  Decreased Interest 0 0 0 0 0  Down, Depressed, Hopeless 0 0 0 0 0  PHQ - 2 Score 0 0 0 0 0  Altered sleeping 0 0 0 0 -  Tired, decreased energy 0 0 0 0 -  Change in appetite 0 0 0 0 -  Feeling bad or failure about yourself  0 0 0 0 -  Trouble concentrating 0 0 0 0 -  Moving slowly or fidgety/restless 0 0 0 0 -  Suicidal thoughts 0 0 0 0 -  PHQ-9 Score 0 0 0 0 -  Difficult doing work/chores Not difficult at all - - - -  Anhedonia: no Weight changes: no Insomnia: none Hypersomnia:  no Fatigue/loss of energy: no Feelings of worthlessness: no Feelings of guilt: no Impaired concentration/indecisiveness: no Suicidal ideations: no  Crying spells: no Recent Stressors/Life Changes: no   Relationship problems: no   Family stress: no     Financial stress: no    Job stress: no    Recent death/loss: no   Relevant past medical, surgical, family and social history reviewed and updated as indicated. Interim medical history since our last visit reviewed. Allergies and medications reviewed and updated.  Review of Systems  Constitutional:  Negative for activity change,  appetite change, diaphoresis, fatigue and fever.  Respiratory:  Negative for cough, chest tightness and shortness of breath.   Cardiovascular:  Negative for chest pain, palpitations and leg swelling.  Gastrointestinal: Negative.   Endocrine: Negative for cold intolerance, heat intolerance, polydipsia, polyphagia and polyuria.  Neurological: Negative.   Psychiatric/Behavioral: Negative.     Per HPI unless specifically indicated above     Objective:    BP 125/81   Pulse 82   Temp 97.9 F (36.6 C) (Oral)   Wt 143 lb 3.2 oz (65 kg)   SpO2 97%   BMI 27.06 kg/m   Wt Readings from Last 3 Encounters:  09/21/20 143 lb 3.2 oz (65 kg)  11/28/19 138 lb (62.6 kg)  05/14/18 130 lb 6.4 oz (59.1 kg)    Physical Exam Vitals and nursing note reviewed.  Constitutional:      General: She is awake.     Appearance: She is well-developed.  HENT:     Head: Normocephalic.     Right Ear: Hearing normal.     Left Ear: Hearing normal.     Nose: Nose normal.     Mouth/Throat:     Mouth: Mucous membranes are moist.  Eyes:     General: Lids are normal.        Right eye: No discharge.        Left eye: No discharge.     Conjunctiva/sclera: Conjunctivae normal.     Pupils: Pupils are equal, round, and reactive to light.  Neck:     Thyroid: No thyromegaly.     Vascular: No carotid bruit or JVD.  Cardiovascular:     Rate and Rhythm: Normal rate and regular rhythm.     Heart sounds: Normal heart sounds. No murmur heard.   No gallop.  Pulmonary:     Effort: Pulmonary effort is normal.     Breath sounds: Normal breath sounds.  Abdominal:     General: Bowel sounds are normal.     Palpations: Abdomen is soft. There is no hepatomegaly or splenomegaly.  Musculoskeletal:     Cervical back: Normal range of motion and neck supple.     Right lower leg: No edema.     Left lower leg: No edema.  Lymphadenopathy:     Cervical: No cervical adenopathy.  Skin:    General: Skin is warm and dry.   Neurological:     Mental Status: She is alert and oriented to person, place, and time.  Psychiatric:        Attention and Perception: Attention normal.        Mood and Affect: Mood normal.        Behavior: Behavior normal. Behavior is cooperative.        Thought Content: Thought content normal.        Judgment: Judgment normal.    Results for orders placed or performed in visit on 12/14/19  Novel Coronavirus,  NAA ([facility])   Specimen: Nasopharyngeal(NP) swabs in vial transport medium   Nasopharynge  Screenin  Result Value Ref Range   SARS-CoV-2, NAA Not Detected Not Detected  SARS-COV-2, NAA 2 DAY TAT   Nasopharynge  Screenin  Result Value Ref Range   SARS-CoV-2, NAA 2 DAY TAT Performed       Assessment & Plan:   Problem List Items Addressed This Visit       Cardiovascular and Mediastinum   Other hypertrophic cardiomyopathy (HCC)    Ongoing, stable.  Continue ASA daily and recommend she take statin as instructed.  Would benefit return to cardiology, declines at this time.  Recommend complete cessation of smoking.       Relevant Medications   losartan (COZAAR) 25 MG tablet   rosuvastatin (CRESTOR) 10 MG tablet   Essential hypertension, benign    Chronic, stable.  BP at goal in office today.  Recommend she monitor BP at least a few mornings a week at home and document.  DASH diet at home.  Continue current medication regimen and adjust as needed.  Labs today: CBC, CMP, TSH.  Return annually, self pay will try to minimize visits to assist patient with needs.  Refills sent in.        Relevant Medications   losartan (COZAAR) 25 MG tablet   rosuvastatin (CRESTOR) 10 MG tablet   Other Relevant Orders   Comprehensive metabolic panel   CBC with Differential/Platelet   TSH     Other   History of CVA (cerebrovascular accident)    CVA's in 2007, 2009, 2014.   WNL neuro exam today.  Educated her at length of stroke prevention goals including LDL <70 and BP <130/80.   Reiterated importance of taking medication daily.       Hyperlipidemia    Chronic, in history of stroke.  Continue Rosuvastatin daily.  Script sent. Lipid panel today.  Return in one year.       Relevant Medications   losartan (COZAAR) 25 MG tablet   rosuvastatin (CRESTOR) 10 MG tablet   Other Relevant Orders   Comprehensive metabolic panel   Lipid Panel w/o Chol/HDL Ratio   Nicotine dependence, cigarettes, uncomplicated    I have recommended complete cessation of tobacco use. I have discussed various options available for assistance with tobacco cessation including over the counter methods (Nicotine gum, patch and lozenges). We also discussed prescription options (Chantix, Nicotine Inhaler / Nasal Spray). The patient is not interested in pursuing any prescription tobacco cessation options at this time.        RLS (restless legs syndrome)    Chronic, stable with Requip.  Refills sent in.  Will adjust dose as needed.       Depression - Primary    Chronic, stable.  Denies SI/HI.  Continue Sertraline daily and adjust dose as needed.  Refills sent in.  Recommend notify provider immediately if worsening mood.       Relevant Medications   sertraline (ZOLOFT) 100 MG tablet   Generalized anxiety disorder    Chronic, stable.  Denies SI/HI.  Continue Sertraline daily and adjust dose as needed.  Refills sent in.  Recommend notify provider immediately if worsening mood.       Relevant Medications   sertraline (ZOLOFT) 100 MG tablet     Follow up plan: Return in about 1 year (around 09/21/2021) for HTN/HLD, MOOD.

## 2020-09-21 NOTE — Patient Instructions (Signed)
Stroke Prevention Some medical conditions and lifestyle choices can lead to a higher risk for a stroke. You can help to prevent a stroke by eating healthy foods and exercising. It also helps to not smoke and to manage any health problems youmay have. How can this condition affect me? A stroke is an emergency. It should be treated right away. A stroke can lead to brain damage or threaten your life. There is a better chance of surviving andgetting better after a stroke if you get medical help right away. What can increase my risk? The following medical conditions may increase your risk of a stroke: Diseases of the heart and blood vessels (cardiovascular disease). High blood pressure (hypertension). Diabetes. High cholesterol. Sickle cell disease. Problems with blood clotting. Being very overweight. Sleeping problems (obstructivesleep apnea). Other risk factors include: Being older than age 60. A history of blood clots, stroke, or mini-stroke (TIA). Race, ethnic background, or a family history of stroke. Smoking or using tobacco products. Taking birth control pills, especially if you smoke. Heavy alcohol and drug use. Not being active. What actions can I take to prevent this? Manage your health conditions High cholesterol. Eat a healthy diet. If this is not enough to manage your cholesterol, you may need to take medicines. Take medicines as told by your doctor. High blood pressure. Try to keep your blood pressure below 130/80. If your blood pressure cannot be managed through a healthy diet and regular exercise, you may need to take medicines. Take medicines as told by your doctor. Ask your doctor if you should check your blood pressure at home. Have your blood pressure checked every year. Diabetes. Eat a healthy diet and get regular exercise. If your blood sugar (glucose) cannot be managed through diet and exercise, you may need to take medicines. Take medicines as told by your  doctor. Talk to your doctor about getting checked for sleeping problems. Signs of a problem can include: Snoring a lot. Feeling very tired. Make sure that you manage any other conditions you have. Nutrition  Follow instructions from your doctor about what to eat or drink. You may be told to: Eat and drink fewer calories each day. Limit how much salt (sodium) you use to 1,500 milligrams (mg) each day. Use only healthy fats for cooking, such as olive oil, canola oil, and sunflower oil. Eat healthy foods. To do this: Choose foods that are high in fiber. These include whole grains, and fresh fruits and vegetables. Eat at least 5 servings of fruits and vegetables a day. Try to fill one-half of your plate with fruits and vegetables at each meal. Choose low-fat (lean) proteins. These include low-fat cuts of meat, chicken without skin, fish, tofu, beans, and nuts. Eat low-fat dairy products. Avoid foods that: Are high in salt. Have saturated fat. Have trans fat. Have cholesterol. Are processed or pre-made. Count how many carbohydrates you eat and drink each day.  Lifestyle If you drink alcohol: Limit how much you have to: 0-1 drink a day for women who are not pregnant. 0-2 drinks a day for men. Know how much alcohol is in your drink. In the [place] one drink equals one 12 oz bottle of beer (355mL), one 5 oz glass of wine (148mL), or one 1 oz glass of hard liquor (44mL). Do not smoke or use any products that have nicotine or tobacco. If you need help quitting, ask your doctor. Avoid secondhand smoke. Do not use drugs. Activity  Try to stay at a healthy   weight. Get at least 30 minutes of exercise on most days, such as: Fast walking. Biking. Swimming.  Medicines Take over-the-counter and prescription medicines only as told by your doctor. Avoid taking birth control pills. Talk to your doctor about the risks of taking birth control pills if: You are over 35 years old. You smoke. You  get very bad headaches. You have had a blood clot. Where to find more information American Stroke Association: www.strokeassociation.org Get help right away if: You or a loved one has any signs of a stroke. "BE FAST" is an easy way to remember the warning signs: B - Balance. Dizziness, sudden trouble walking, or loss of balance. E - Eyes. Trouble seeing or a change in how you see. F - Face. Sudden weakness or loss of feeling of the face. The face or eyelid may droop on one side. A - Arms. Weakness or loss of feeling in an arm. This happens all of a sudden and most often on one side of the body. S - Speech. Sudden trouble speaking, slurred speech, or trouble understanding what people say. T - Time. Time to call emergency services. Write down what time symptoms started. You or a loved one has other signs of a stroke, such as: A sudden, very bad headache with no known cause. Feeling like you may vomit (nausea). Vomiting. A seizure. These symptoms may be an emergency. Get help right away. Call your local emergency services (911 in the [place] Do not wait to see if the symptoms will go away. Do not drive yourself to the hospital. Summary You can help to prevent a stroke by eating healthy, exercising, and not smoking. It also helps to manage any health problems you have. Do not smoke or use any products that contain nicotine or tobacco. Get help right away if you or a loved one has any signs of a stroke. This information is not intended to replace advice given to you by your health care provider. Make sure you discuss any questions you have with your healthcare provider. Document Revised: 10/17/2019 Document Reviewed: 10/17/2019 Elsevier Patient Education  2022 Elsevier Inc.  

## 2020-09-21 NOTE — Assessment & Plan Note (Signed)
Chronic, stable with Requip.  Refills sent in.  Will adjust dose as needed.

## 2020-09-21 NOTE — Assessment & Plan Note (Signed)
Chronic, in history of stroke.  Continue Rosuvastatin daily.  Script sent. Lipid panel today.  Return in one year.

## 2020-09-21 NOTE — Assessment & Plan Note (Signed)
CVA's in 2007, 2009, 2014.   WNL neuro exam today.  Educated her at length of stroke prevention goals including LDL <70 and BP <130/80.  Reiterated importance of taking medication daily. 

## 2020-09-21 NOTE — Assessment & Plan Note (Signed)
Chronic, stable.  Denies SI/HI.  Continue Sertraline daily and adjust dose as needed.  Refills sent in.  Recommend notify provider immediately if worsening mood.

## 2020-09-21 NOTE — Assessment & Plan Note (Signed)
Chronic, stable.  Denies SI/HI.  Continue Sertraline daily and adjust dose as needed.  Refills sent in.  Recommend notify provider immediately if worsening mood. 

## 2020-09-21 NOTE — Telephone Encounter (Signed)
Pt is scheduled today.

## 2020-09-22 ENCOUNTER — Other Ambulatory Visit: Payer: Self-pay | Admitting: Nurse Practitioner

## 2020-09-22 DIAGNOSIS — E875 Hyperkalemia: Secondary | ICD-10-CM

## 2020-09-22 DIAGNOSIS — E7800 Pure hypercholesterolemia, unspecified: Secondary | ICD-10-CM

## 2020-09-22 LAB — CBC WITH DIFFERENTIAL/PLATELET
Basophils Absolute: 0.1 10*3/uL (ref 0.0–0.2)
Basos: 1 %
EOS (ABSOLUTE): 0.2 10*3/uL (ref 0.0–0.4)
Eos: 2 %
Hematocrit: 42.4 % (ref 34.0–46.6)
Hemoglobin: 13.7 g/dL (ref 11.1–15.9)
Immature Grans (Abs): 0 10*3/uL (ref 0.0–0.1)
Immature Granulocytes: 0 %
Lymphocytes Absolute: 2.6 10*3/uL (ref 0.7–3.1)
Lymphs: 35 %
MCH: 28.5 pg (ref 26.6–33.0)
MCHC: 32.3 g/dL (ref 31.5–35.7)
MCV: 88 fL (ref 79–97)
Monocytes Absolute: 0.5 10*3/uL (ref 0.1–0.9)
Monocytes: 7 %
Neutrophils Absolute: 4.1 10*3/uL (ref 1.4–7.0)
Neutrophils: 55 %
RBC: 4.81 x10E6/uL (ref 3.77–5.28)
RDW: 13.3 % (ref 11.7–15.4)
WBC: 7.5 10*3/uL (ref 3.4–10.8)

## 2020-09-22 LAB — COMPREHENSIVE METABOLIC PANEL
ALT: 8 IU/L (ref 0–32)
AST: 12 IU/L (ref 0–40)
Albumin/Globulin Ratio: 1.8 (ref 1.2–2.2)
Albumin: 4.5 g/dL (ref 3.8–4.8)
Alkaline Phosphatase: 103 IU/L (ref 44–121)
BUN/Creatinine Ratio: 14 (ref 12–28)
BUN: 14 mg/dL (ref 8–27)
Bilirubin Total: 0.2 mg/dL (ref 0.0–1.2)
CO2: 25 mmol/L (ref 20–29)
Calcium: 9.7 mg/dL (ref 8.7–10.3)
Chloride: 101 mmol/L (ref 96–106)
Creatinine, Ser: 1 mg/dL (ref 0.57–1.00)
Globulin, Total: 2.5 g/dL (ref 1.5–4.5)
Glucose: 82 mg/dL (ref 65–99)
Potassium: 5.3 mmol/L — ABNORMAL HIGH (ref 3.5–5.2)
Sodium: 140 mmol/L (ref 134–144)
Total Protein: 7 g/dL (ref 6.0–8.5)
eGFR: 63 mL/min/{1.73_m2} (ref >59)

## 2020-09-22 LAB — LIPID PANEL W/O CHOL/HDL RATIO
Cholesterol, Total: 181 mg/dL (ref 100–199)
HDL: 59 mg/dL (ref >39)
LDL Chol Calc (NIH): 102 mg/dL — ABNORMAL HIGH (ref 0–99)
Triglycerides: 113 mg/dL (ref 0–149)
VLDL Cholesterol Cal: 20 mg/dL (ref 5–40)

## 2020-09-22 LAB — TSH: TSH: 0.784 u[IU]/mL (ref 0.450–4.500)

## 2020-09-22 NOTE — Progress Notes (Signed)
Good morning, do not believe patient checks MyChart, please call her: Good morning Vicki Rosales, your labs have returned.  Kidney and liver function are normal.  Potassium is mildly elevated at 5.3, normal levels 3.5 to 5.2, I would like you to lower potassium rich foods like bananas, potatoes, dried fruit, mangoes, avocados.  Would like you to return in 4 weeks for lab visit only to recheck this level, as Losartan can also increase potassium levels.  CBC shows no anemia and thyroid normal.  Cholesterol levels show LDL, bad cholesterol, above goal for stroke prevention.  Please ensure to restart your Rosuvastatin as sent in, will recheck fasting lab in 4 weeks and may need to increase dosing if not at goal.  Please schedule 4 week lab only visit.  Any questions? Keep being awesome!!  Thank you for allowing me to participate in your care.  I appreciate you. Kindest regards, Davon Folta

## 2021-05-29 ENCOUNTER — Other Ambulatory Visit: Payer: Self-pay

## 2021-05-29 ENCOUNTER — Observation Stay: Payer: Self-pay

## 2021-05-29 ENCOUNTER — Emergency Department: Payer: Self-pay

## 2021-05-29 ENCOUNTER — Observation Stay
Admission: EM | Admit: 2021-05-29 | Discharge: 2021-05-30 | Disposition: A | Discharge status: Home / Self Care | Payer: Self-pay | Attending: Internal Medicine | Admitting: Internal Medicine

## 2021-05-29 DIAGNOSIS — Z7901 Long term (current) use of anticoagulants: Secondary | ICD-10-CM | POA: Insufficient documentation

## 2021-05-29 DIAGNOSIS — Z8673 Personal history of transient ischemic attack (TIA), and cerebral infarction without residual deficits: Secondary | ICD-10-CM

## 2021-05-29 DIAGNOSIS — G459 Transient cerebral ischemic attack, unspecified: Principal | ICD-10-CM | POA: Insufficient documentation

## 2021-05-29 DIAGNOSIS — F17210 Nicotine dependence, cigarettes, uncomplicated: Secondary | ICD-10-CM | POA: Insufficient documentation

## 2021-05-29 DIAGNOSIS — Z20822 Contact with and (suspected) exposure to covid-19: Secondary | ICD-10-CM | POA: Insufficient documentation

## 2021-05-29 DIAGNOSIS — I110 Hypertensive heart disease with heart failure: Secondary | ICD-10-CM | POA: Insufficient documentation

## 2021-05-29 DIAGNOSIS — Z7982 Long term (current) use of aspirin: Secondary | ICD-10-CM | POA: Insufficient documentation

## 2021-05-29 DIAGNOSIS — H5340 Unspecified visual field defects: Secondary | ICD-10-CM | POA: Diagnosis present

## 2021-05-29 DIAGNOSIS — Z79899 Other long term (current) drug therapy: Secondary | ICD-10-CM | POA: Insufficient documentation

## 2021-05-29 DIAGNOSIS — I5020 Unspecified systolic (congestive) heart failure: Secondary | ICD-10-CM | POA: Insufficient documentation

## 2021-05-29 LAB — CBC
HCT: 45.3 % (ref 36.0–46.0)
Hemoglobin: 14.3 g/dL (ref 12.0–15.0)
MCH: 28.3 pg (ref 26.0–34.0)
MCHC: 31.6 g/dL (ref 30.0–36.0)
MCV: 89.7 fL (ref 80.0–100.0)
Platelets: 203 10*3/uL (ref 150–400)
RBC: 5.05 MIL/uL (ref 3.87–5.11)
RDW: 14.2 % (ref 11.5–15.5)
WBC: 6.3 10*3/uL (ref 4.0–10.5)
nRBC: 0 % (ref 0.0–0.2)

## 2021-05-29 LAB — COMPREHENSIVE METABOLIC PANEL
ALT: 11 U/L (ref 0–44)
AST: 18 U/L (ref 15–41)
Albumin: 4.1 g/dL (ref 3.5–5.0)
Alkaline Phosphatase: 84 U/L (ref 38–126)
Anion gap: 11 (ref 5–15)
BUN: 14 mg/dL (ref 8–23)
CO2: 25 mmol/L (ref 22–32)
Calcium: 9.6 mg/dL (ref 8.9–10.3)
Chloride: 104 mmol/L (ref 98–111)
Creatinine, Ser: 0.98 mg/dL (ref 0.44–1.00)
GFR, Estimated: >60 mL/min (ref >60)
Glucose, Bld: 119 mg/dL — ABNORMAL HIGH (ref 70–99)
Potassium: 3.6 mmol/L (ref 3.5–5.1)
Sodium: 140 mmol/L (ref 135–145)
Total Bilirubin: 0.5 mg/dL (ref 0.3–1.2)
Total Protein: 7.8 g/dL (ref 6.5–8.1)

## 2021-05-29 LAB — RESP PANEL BY RT-PCR (FLU A&B, COVID) ARPGX2
Influenza A by PCR: NEGATIVE
Influenza B by PCR: NEGATIVE
SARS Coronavirus 2 by RT PCR: NEGATIVE

## 2021-05-29 LAB — HEMOGLOBIN A1C
Hgb A1c MFr Bld: 6 % — ABNORMAL HIGH (ref 4.8–5.6)
Mean Plasma Glucose: 126 mg/dL

## 2021-05-29 LAB — DIFFERENTIAL
Abs Immature Granulocytes: 0.03 10*3/uL (ref 0.00–0.07)
Basophils Absolute: 0 10*3/uL (ref 0.0–0.1)
Basophils Relative: 1 %
Eosinophils Absolute: 0.2 10*3/uL (ref 0.0–0.5)
Eosinophils Relative: 2 %
Immature Granulocytes: 1 %
Lymphocytes Relative: 25 %
Lymphs Abs: 1.6 10*3/uL (ref 0.7–4.0)
Monocytes Absolute: 0.4 10*3/uL (ref 0.1–1.0)
Monocytes Relative: 6 %
Neutro Abs: 4.1 10*3/uL (ref 1.7–7.7)
Neutrophils Relative %: 65 %

## 2021-05-29 LAB — LDL CHOLESTEROL, DIRECT: Direct LDL: 83.1 mg/dL (ref 0–99)

## 2021-05-29 LAB — APTT: aPTT: 27 seconds (ref 24–36)

## 2021-05-29 LAB — PROTIME-INR
INR: 0.9 (ref 0.8–1.2)
Prothrombin Time: 12.6 seconds (ref 11.4–15.2)

## 2021-05-29 LAB — CBG MONITORING, ED: Glucose-Capillary: 117 mg/dL — ABNORMAL HIGH (ref 70–99)

## 2021-05-29 MED ORDER — ROPINIROLE HCL 1 MG PO TABS
1.0000 mg | ORAL_TABLET | Freq: Every day | ORAL | Status: DC
  Administered 2021-05-29: 1 mg via ORAL
  Filled 2021-05-29: qty 1

## 2021-05-29 MED ORDER — LORAZEPAM 1 MG PO TABS
1.0000 mg | ORAL_TABLET | Freq: Once | ORAL | Status: AC | PRN
  Administered 2021-05-29: 1 mg via ORAL
  Filled 2021-05-29: qty 1

## 2021-05-29 MED ORDER — LOSARTAN POTASSIUM 50 MG PO TABS
25.0000 mg | ORAL_TABLET | Freq: Every day | ORAL | Status: DC
Start: 2021-05-30 — End: 2021-05-30
  Administered 2021-05-30: 25 mg via ORAL
  Filled 2021-05-29: qty 1

## 2021-05-29 MED ORDER — SENNOSIDES-DOCUSATE SODIUM 8.6-50 MG PO TABS: 1.0000 | ORAL_TABLET | Freq: Every evening | ORAL | Status: DC | PRN

## 2021-05-29 MED ORDER — ROSUVASTATIN CALCIUM 10 MG PO TABS
10.0000 mg | ORAL_TABLET | Freq: Every day | ORAL | Status: DC
  Administered 2021-05-29 – 2021-05-30 (×2): 10 mg via ORAL
  Filled 2021-05-29 (×2): qty 1

## 2021-05-29 MED ORDER — ENOXAPARIN SODIUM 40 MG/0.4ML IJ SOSY
40.0000 mg | PREFILLED_SYRINGE | INTRAMUSCULAR | Status: DC
  Administered 2021-05-30: 40 mg via SUBCUTANEOUS
  Filled 2021-05-29: qty 0.4

## 2021-05-29 MED ORDER — ACETAMINOPHEN 160 MG/5ML PO SOLN
650.0000 mg | ORAL | Status: DC | PRN
  Filled 2021-05-29: qty 20.3

## 2021-05-29 MED ORDER — SODIUM CHLORIDE 0.9 % IV SOLN: INTRAVENOUS | Status: DC

## 2021-05-29 MED ORDER — ACETAMINOPHEN 325 MG PO TABS: 650.0000 mg | ORAL_TABLET | ORAL | Status: DC | PRN

## 2021-05-29 MED ORDER — SODIUM CHLORIDE 0.9% FLUSH
3.0000 mL | Freq: Once | INTRAVENOUS | Status: AC
  Administered 2021-05-29: 3 mL via INTRAVENOUS

## 2021-05-29 MED ORDER — ACETAMINOPHEN 325 MG RE SUPP
650.0000 mg | RECTAL | Status: DC | PRN
  Filled 2021-05-29: qty 2

## 2021-05-29 MED ORDER — STROKE: EARLY STAGES OF RECOVERY BOOK: Freq: Once | Status: AC

## 2021-05-29 MED ORDER — GADOBUTROL 1 MMOL/ML IV SOLN
7.0000 mL | Freq: Once | INTRAVENOUS | Status: AC | PRN
  Administered 2021-05-29: 7 mL via INTRAVENOUS
  Filled 2021-05-29: qty 7.5

## 2021-05-29 MED ORDER — CLOPIDOGREL BISULFATE 75 MG PO TABS
75.0000 mg | ORAL_TABLET | Freq: Every day | ORAL | Status: DC
  Administered 2021-05-30: 75 mg via ORAL
  Filled 2021-05-29: qty 1

## 2021-05-29 MED ORDER — SERTRALINE HCL 50 MG PO TABS
100.0000 mg | ORAL_TABLET | Freq: Every day | ORAL | Status: DC
  Administered 2021-05-30: 100 mg via ORAL
  Filled 2021-05-29: qty 2

## 2021-05-29 MED ORDER — ASPIRIN 81 MG PO CHEW
81.0000 mg | CHEWABLE_TABLET | Freq: Every day | ORAL | Status: DC
  Administered 2021-05-30: 81 mg via ORAL
  Filled 2021-05-29: qty 1

## 2021-05-29 NOTE — ED Triage Notes (Signed)
Pt states she woke up with loss of peripheral vision loss in the left eye this morning, states she was fine when she went to bed last night, denies any pain , numbness or weakness. States she has a hx of TIA in the past ?

## 2021-05-29 NOTE — H&P (Addendum)
History and Physical:    Vicki Rosales   GUY:[id number] DOB: [birth year] DOA: 05/29/2021  Referring MD/provider:  Dorothea Glassman, MD PCP: Marjie Skiff, NP   Patient coming from: Home  Chief Complaint: Loss of vision in the left eye  History of Present Illness:   Vicki Rosales is a 66 y.o. female with medical history significant for history of stroke (x2 in 2007 and 2008), seizure, cardiomyopathy, hyperlipidemia, migraine, restless leg syndrome, who presented to the hospital because of loss of vision in the left eye.  She said she noticed the changes in her vision earlier this morning when she was getting ready to go to work.  It lasted about 10 minutes.  She decided that she will try and go to work.  However, loss of vision in the left eye recurred.  She decided to go to the emergency room for further evaluation.  By the time she got to the emergency room, vision in the left eye had already been restarted.  She had no other symptoms.  No headache, dizziness, confusion, changes in speech, unsteady gait, weakness, tingling or numbness in the extremities.  ED Course:  The patient completed CT head and MRI brain, MRA head and neck  ROS:   ROS all other systems reviewed were negative  Past Medical History:   Past Medical History:  Diagnosis Date   Anxiety    Cardiomyopathy (HCC)    EF of 35 %   CVA (cerebral infarction) 2007 and 2008   followed by Dr, Sherryll Burger   Depression    GERD (gastroesophageal reflux disease)    History of seizure    after her stroke   Hyperlipidemia    Migraines    ocular   RLS (restless legs syndrome)    Seizures (HCC)    Stroke (HCC)    TIA (transient ischemic attack) 2014   followed by Dr. Sherryll Burger   Tobacco abuse     Past Surgical History:   Past Surgical History:  Procedure Laterality Date   CESAREAN SECTION      Social History:   Social History   Socioeconomic History   Marital status: Divorced    Spouse name: Not on file    Number of children: Not on file   Years of education: Not on file   Highest education level: Not on file  Occupational History   Not on file  Tobacco Use   Smoking status: Every Day    Packs/day: 0.50    Years: 30.00    Pack years: 15.00    Types: Cigarettes   Smokeless tobacco: Never  Vaping Use   Vaping Use: Never used  Substance and Sexual Activity   Alcohol use: No   Drug use: No   Sexual activity: Never  Other Topics Concern   Not on file  Social History Narrative   Not on file   Social Determinants of Health   Financial Resource Strain: Not on file  Food Insecurity: Not on file  Transportation Needs: Not on file  Physical Activity: Not on file  Stress: Not on file  Social Connections: Not on file  Intimate Partner Violence: Not on file    Allergies   Pravastatin  Family history:   Family History  Problem Relation Age of Onset   Hypertension Mother    Heart attack Father    Heart disease Father        CABG   Heart disease Sister    Hyperlipidemia Sister  Hypertension Sister    Asthma Sister    Diabetes Sister    COPD Sister    Hypertension Brother    Hyperlipidemia Brother    Heart disease Maternal Grandmother        CHF   Diabetes Maternal Grandfather    Diabetes Other     Current Medications:   Prior to Admission medications   Medication Sig Start Date End Date Taking? Authorizing Provider  aspirin 325 MG tablet Take 325 mg by mouth daily.   Yes [provider]  clopidogrel (PLAVIX) 75 MG tablet Take 1 tablet by mouth once daily 09/21/20  Yes Cannady, Jolene T, NP  losartan (COZAAR) 25 MG tablet Take 1 tablet (25 mg total) by mouth daily. 09/21/20  Yes Cannady, Jolene T, NP  rOPINIRole (REQUIP) 2 MG tablet Take 1 tablet (2 mg total) by mouth at bedtime. 09/21/20  Yes Cannady, Jolene T, NP  sertraline (ZOLOFT) 100 MG tablet Take 1 tablet (100 mg total) by mouth daily. 09/21/20  Yes Cannady, Jolene T, NP  coal tar (NEUTROGENA T-GEL) 0.5  % shampoo Apply topically at bedtime as needed. Patient not taking: Reported on 05/29/2021 11/28/19   Marnee Guarneri T, NP  rosuvastatin (CRESTOR) 10 MG tablet Take 1 tablet (10 mg total) by mouth daily. 09/21/20   Marnee Guarneri T, NP    Physical Exam:   Vitals:   05/29/21 1000 05/29/21 1131 05/29/21 1300 05/29/21 1600  BP: 139/76 94/69 128/74 125/83  Pulse: 75  78 84  Resp: (!) 21 16 12  (!) 23  SpO2: 97% 97% 96% 97%  Weight:      Height:         Physical Exam: Blood pressure 125/83, pulse 84, resp. rate (!) 23, height 5' (1.524 m), weight 63.5 kg, SpO2 97 %. Gen: No acute distress. Head: Normocephalic, atraumatic. Eyes: Pupils equal, round and reactive to light. Extraocular movements intact.  Sclerae nonicteric.  No visual field cut Mouth: Moist mucous membranes Neck: Supple, no thyromegaly, no lymphadenopathy, no jugular venous distention. Chest: Lungs are clear to auscultation with good air movement. No rales, rhonchi or wheezes.  CV: Heart sounds are regular with an S1, S2. No murmurs, rubs or gallops.  Abdomen: Soft, nontender, nondistended with normal active bowel sounds. No palpable masses. Extremities: Extremities are without clubbing, or cyanosis. No edema. Pedal pulses 2+.  Skin: Warm and dry. No rashes, lesions or wounds Neuro: Alert and oriented times 3; grossly nonfocal.  Psych: Insight is good and judgment is appropriate. Mood and affect normal.   Data Review:    Labs: Basic Metabolic Panel: Recent Labs  Lab 05/29/21 0814  NA 140  K 3.6  CL 104  CO2 25  GLUCOSE 119*  BUN 14  CREATININE 0.98  CALCIUM 9.6   Liver Function Tests: Recent Labs  Lab 05/29/21 0814  AST 18  ALT 11  ALKPHOS 84  BILITOT 0.5  PROT 7.8  ALBUMIN 4.1   No results for input(s): LIPASE, AMYLASE in the last 168 hours. No results for input(s): AMMONIA in the last 168 hours. CBC: Recent Labs  Lab 05/29/21 0814  WBC 6.3  NEUTROABS 4.1  HGB 14.3  HCT 45.3  MCV 89.7   PLT 203   Cardiac Enzymes: No results for input(s): CKTOTAL, CKMB, CKMBINDEX, TROPONINI in the last 168 hours.  BNP (last 3 results) No results for input(s): PROBNP in the last 8760 hours. CBG: Recent Labs  Lab 05/29/21 0807  GLUCAP 117*  Urinalysis    Component Value Date/Time   COLORURINE YELLOW (A) 01/01/2017 1954   APPEARANCEUR CLEAR (A) 01/01/2017 1954   APPEARANCEUR Clear 07/06/2012 1151   LABSPEC 1.012 01/01/2017 1954   LABSPEC 1.003 07/06/2012 1151   PHURINE 6.0 01/01/2017 1954   GLUCOSEU NEGATIVE 01/01/2017 1954   GLUCOSEU Negative 07/06/2012 1151   HGBUR SMALL (A) 01/01/2017 1954   BILIRUBINUR NEGATIVE 01/01/2017 1954   BILIRUBINUR Negative 07/06/2012 1151   KETONESUR 20 (A) 01/01/2017 1954   PROTEINUR NEGATIVE 01/01/2017 1954   NITRITE NEGATIVE 01/01/2017 1954   LEUKOCYTESUR SMALL (A) 01/01/2017 1954   LEUKOCYTESUR Negative 07/06/2012 1151      Radiographic Studies: MR ANGIO HEAD WO CONTRAST  Result Date: 05/29/2021 CLINICAL DATA:  Neuro deficit, acute, stroke suspected. Left-sided vision loss. EXAM: MRI HEAD WITHOUT CONTRAST MRA HEAD WITHOUT CONTRAST MRA OF THE NECK WITHOUT AND WITH CONTRAST TECHNIQUE: Multiplanar, multi-echo pulse sequences of the brain and surrounding structures were acquired without intravenous contrast. Angiographic images of the Circle of Willis were acquired using MRA technique without intravenous contrast. Angiographic images of the neck were acquired using MRA technique without and with intravenous contrast. Carotid stenosis measurements (when applicable) are obtained utilizing NASCET criteria, using the distal internal carotid diameter as the denominator. CONTRAST:  18mL GADAVIST GADOBUTROL 1 MMOL/ML IV SOLN COMPARISON:  Head CT 05/29/2021. Head MRI, head MRA, and neck MRA 07/07/2012. FINDINGS: MR HEAD FINDINGS Brain: There is no evidence of an acute infarct, mass, midline shift, or extra-axial fluid collection. Chronic infarcts  involving the right frontal lobe primarily at the level of the operculum, right insula, right corona radiata, and left occipital lobe are unchanged from the prior MRI, and associated chronic blood products are noted. A small chronic right occipital infarct is new. Small chronic infarcts involving the left parietal lobe and right caudate body are unchanged from the prior MRI, while chronic bilateral cerebellar infarcts have increased in number. Moderate patchy T2 hyperintensity in the pons has mildly progressed and is nonspecific but compatible with chronic small vessel ischemia. Mild cerebral atrophy is within normal limits for age. Vascular: Major intracranial vascular flow voids are preserved. Skull and upper cervical spine: Unremarkable bone marrow signal. Sinuses/Orbits: Unremarkable orbits. Paranasal sinuses and mastoid air cells are clear. Other: None. MRA HEAD FINDINGS Anterior circulation: The internal carotid arteries are widely patent from skull base to carotid termini. ACAs and MCAs are patent without evidence of a proximal branch occlusion or significant proximal stenosis. No aneurysm is identified. Posterior circulation: The included portions of the intracranial vertebral arteries are widely patent to the basilar with the left being dominant. Patent PICA, AICA, and SCA origins are seen bilaterally. The basilar artery is widely patent. Posterior communicating arteries are diminutive or absent. Both PCAs are patent without evidence of a significant proximal stenosis. No aneurysm is identified. Anatomic variants:None. MRA NECK FINDINGS Aortic arch: Standard 3 vessel aortic arch. Widely patent brachiocephalic and subclavian arteries. Right carotid system: Patent without evidence of significant common or internal carotid artery stenosis. Left carotid system: Patent without evidence of significant common or internal carotid artery stenosis. Vertebral arteries: Patent with antegrade flow bilaterally.  Moderately dominant left vertebral artery. No evidence of a significant stenosis or dissection. IMPRESSION: 1. No acute intracranial abnormality. 2. Chronic ischemia with multiple old infarcts as above, progressed from 2014. 3. Negative head MRA. 4. Negative neck MRA. Electronically Signed   By: Sebastian Ache M.D.   On: 05/29/2021 12:29   MR ANGIO NECK W WO  CONTRAST  Result Date: 05/29/2021 CLINICAL DATA:  Neuro deficit, acute, stroke suspected. Left-sided vision loss. EXAM: MRI HEAD WITHOUT CONTRAST MRA HEAD WITHOUT CONTRAST MRA OF THE NECK WITHOUT AND WITH CONTRAST TECHNIQUE: Multiplanar, multi-echo pulse sequences of the brain and surrounding structures were acquired without intravenous contrast. Angiographic images of the Circle of Willis were acquired using MRA technique without intravenous contrast. Angiographic images of the neck were acquired using MRA technique without and with intravenous contrast. Carotid stenosis measurements (when applicable) are obtained utilizing NASCET criteria, using the distal internal carotid diameter as the denominator. CONTRAST:  58mL GADAVIST GADOBUTROL 1 MMOL/ML IV SOLN COMPARISON:  Head CT 05/29/2021. Head MRI, head MRA, and neck MRA 07/07/2012. FINDINGS: MR HEAD FINDINGS Brain: There is no evidence of an acute infarct, mass, midline shift, or extra-axial fluid collection. Chronic infarcts involving the right frontal lobe primarily at the level of the operculum, right insula, right corona radiata, and left occipital lobe are unchanged from the prior MRI, and associated chronic blood products are noted. A small chronic right occipital infarct is new. Small chronic infarcts involving the left parietal lobe and right caudate body are unchanged from the prior MRI, while chronic bilateral cerebellar infarcts have increased in number. Moderate patchy T2 hyperintensity in the pons has mildly progressed and is nonspecific but compatible with chronic small vessel ischemia. Mild  cerebral atrophy is within normal limits for age. Vascular: Major intracranial vascular flow voids are preserved. Skull and upper cervical spine: Unremarkable bone marrow signal. Sinuses/Orbits: Unremarkable orbits. Paranasal sinuses and mastoid air cells are clear. Other: None. MRA HEAD FINDINGS Anterior circulation: The internal carotid arteries are widely patent from skull base to carotid termini. ACAs and MCAs are patent without evidence of a proximal branch occlusion or significant proximal stenosis. No aneurysm is identified. Posterior circulation: The included portions of the intracranial vertebral arteries are widely patent to the basilar with the left being dominant. Patent PICA, AICA, and SCA origins are seen bilaterally. The basilar artery is widely patent. Posterior communicating arteries are diminutive or absent. Both PCAs are patent without evidence of a significant proximal stenosis. No aneurysm is identified. Anatomic variants:None. MRA NECK FINDINGS Aortic arch: Standard 3 vessel aortic arch. Widely patent brachiocephalic and subclavian arteries. Right carotid system: Patent without evidence of significant common or internal carotid artery stenosis. Left carotid system: Patent without evidence of significant common or internal carotid artery stenosis. Vertebral arteries: Patent with antegrade flow bilaterally. Moderately dominant left vertebral artery. No evidence of a significant stenosis or dissection. IMPRESSION: 1. No acute intracranial abnormality. 2. Chronic ischemia with multiple old infarcts as above, progressed from 2014. 3. Negative head MRA. 4. Negative neck MRA. Electronically Signed   By: Logan Bores M.D.   On: 05/29/2021 12:29   MR BRAIN WO CONTRAST  Result Date: 05/29/2021 CLINICAL DATA:  Neuro deficit, acute, stroke suspected. Left-sided vision loss. EXAM: MRI HEAD WITHOUT CONTRAST MRA HEAD WITHOUT CONTRAST MRA OF THE NECK WITHOUT AND WITH CONTRAST TECHNIQUE: Multiplanar,  multi-echo pulse sequences of the brain and surrounding structures were acquired without intravenous contrast. Angiographic images of the Circle of Willis were acquired using MRA technique without intravenous contrast. Angiographic images of the neck were acquired using MRA technique without and with intravenous contrast. Carotid stenosis measurements (when applicable) are obtained utilizing NASCET criteria, using the distal internal carotid diameter as the denominator. CONTRAST:  19mL GADAVIST GADOBUTROL 1 MMOL/ML IV SOLN COMPARISON:  Head CT 05/29/2021. Head MRI, head MRA, and neck MRA 07/07/2012. FINDINGS:  MR HEAD FINDINGS Brain: There is no evidence of an acute infarct, mass, midline shift, or extra-axial fluid collection. Chronic infarcts involving the right frontal lobe primarily at the level of the operculum, right insula, right corona radiata, and left occipital lobe are unchanged from the prior MRI, and associated chronic blood products are noted. A small chronic right occipital infarct is new. Small chronic infarcts involving the left parietal lobe and right caudate body are unchanged from the prior MRI, while chronic bilateral cerebellar infarcts have increased in number. Moderate patchy T2 hyperintensity in the pons has mildly progressed and is nonspecific but compatible with chronic small vessel ischemia. Mild cerebral atrophy is within normal limits for age. Vascular: Major intracranial vascular flow voids are preserved. Skull and upper cervical spine: Unremarkable bone marrow signal. Sinuses/Orbits: Unremarkable orbits. Paranasal sinuses and mastoid air cells are clear. Other: None. MRA HEAD FINDINGS Anterior circulation: The internal carotid arteries are widely patent from skull base to carotid termini. ACAs and MCAs are patent without evidence of a proximal branch occlusion or significant proximal stenosis. No aneurysm is identified. Posterior circulation: The included portions of the intracranial  vertebral arteries are widely patent to the basilar with the left being dominant. Patent PICA, AICA, and SCA origins are seen bilaterally. The basilar artery is widely patent. Posterior communicating arteries are diminutive or absent. Both PCAs are patent without evidence of a significant proximal stenosis. No aneurysm is identified. Anatomic variants:None. MRA NECK FINDINGS Aortic arch: Standard 3 vessel aortic arch. Widely patent brachiocephalic and subclavian arteries. Right carotid system: Patent without evidence of significant common or internal carotid artery stenosis. Left carotid system: Patent without evidence of significant common or internal carotid artery stenosis. Vertebral arteries: Patent with antegrade flow bilaterally. Moderately dominant left vertebral artery. No evidence of a significant stenosis or dissection. IMPRESSION: 1. No acute intracranial abnormality. 2. Chronic ischemia with multiple old infarcts as above, progressed from 2014. 3. Negative head MRA. 4. Negative neck MRA. Electronically Signed   By: Logan Bores M.D.   On: 05/29/2021 12:29   CT HEAD CODE STROKE WO CONTRAST`  Result Date: 05/29/2021 CLINICAL DATA:  Code stroke.  Visual disturbance EXAM: CT HEAD WITHOUT CONTRAST TECHNIQUE: Contiguous axial images were obtained from the base of the skull through the vertex without intravenous contrast. RADIATION DOSE REDUCTION: This exam was performed according to the departmental dose-optimization program which includes automated exposure control, adjustment of the mA and/or kV according to patient size and/or use of iterative reconstruction technique. COMPARISON:  2014 FINDINGS: Brain: There is no acute intracranial hemorrhage, mass effect, or edema. No acute appearing loss of gray-white differentiation. There are chronic infarcts of the right frontal opercular region and insula, left occipital lobe, bilateral cerebellar hemispheres, and right centrum semiovale. Additional patchy  hypoattenuation in the supratentorial white matter may reflect chronic microvascular ischemic changes. There is no hydrocephalus or extra-axial collection. Vascular: No hyperdense vessel. Skull: Unremarkable. Sinuses/Orbits: No acute abnormality. Other: Mastoid air cells are clear. ASPECTS ([place] Stroke Program Early CT Score) - Ganglionic level infarction (caudate, lentiform nuclei, internal capsule, insula, M1-M3 cortex): 7 - Supraganglionic infarction (M4-M6 cortex): 3 Total score (0-10 with 10 being normal): 10 IMPRESSION: There is no acute intracranial hemorrhage or evidence of acute infarction. ASPECT score is 10. Chronic findings detailed above. These results were communicated to Dr. Quinn Axe at 8:35 am on 05/29/2021 by text page via the [facility] messaging system. Electronically Signed   By: Macy Mis M.D.   On: 05/29/2021 08:37  EKG: Independently reviewed by me.  EKG showed normal sinus rhythm   Assessment/Plan:   Principal Problem:   Visual field cut    Body mass index is 27.34 kg/m.   Temporary loss of vision in the left eye/left visual field cut in a patient with history of stroke: Admit to MedSurg and monitor on telemetry.  Vision in the left eye has been restored.  MRI brain, head and neck did not show any acute abnormality or large vessel occlusion.  No evidence of stroke.  Patient is already being evaluated by the neurologist.  2D echo has been ordered and is pending.  Continue rosuvastatin, aspirin and Plavix.  Ordered physical therapy, Occupational Therapy and speech therapy.  Hypertension: Continue losartan  Depression and anxiety: Continue sertraline  Other comorbidities include hyperlipidemia, GERD, restless leg syndrome    Other information:   DVT prophylaxis:   Lovenox  Code Status: Full code. Family Communication: Estill Bamberg, daughter, at the bedside Disposition Plan: Plan to discharge home tomorrow Consults called: Neurologist Admission status:  Observation     Jalyah Weinheimer [facility] Pager: Please check [email or web address]   How to contact the [facility] Attending or Consulting provider [place] or covering provider during after hours [place], for this patient?   Check the care team in [facility] and look for a) attending/consulting [facility] provider listed and b) the [facility] team listed Log into [email or web address] and use [place]'s universal password to access. If you do not have the password, please contact the hospital operator. Locate the [facility] provider you are looking for under [facility] and page to a number that you can be directly reached. If you still have difficulty reaching the provider, please page the [facility] (Director on Call) for the Hospitalists listed on amion for assistance.  05/29/2021, 5:11 PM

## 2021-05-29 NOTE — ED Provider Notes (Signed)
? ?[facility] ?Provider Note ? ? ? Event Date/Time  ? First MD Initiated Contact with Patient 05/29/21 0802   ?  (approximate) ? ? ?History  ? ?Visual Field Change ? ? ?HPI ? ?Vicki Rosales is a 66 y.o. female who reports that she was getting ready for work this morning at 7 AM she lost the peripheral vision on the left side of her visual field.  It has since come back slowly and is now completely back to normal.  She has a history of 2 prior strokes ? ?  ? ? ?Physical Exam  ? ?Triage Vital Signs: ?ED Triage Vitals [05/29/21 0754]  ?Enc Vitals Group  ?   BP   ?   Pulse   ?   Resp   ?   Temp   ?   Temp src   ?   SpO2   ?   Weight 140 lb (63.5 kg)  ?   Height 5' (1.524 m)  ?   Head Circumference   ?   Peak Flow   ?   Pain Score 0  ?   Pain Loc   ?   Pain Edu?   ?   Excl. in GC?   ? ? ?Most recent vital signs: ?Vitals:  ? 05/29/21 1131 05/29/21 1300  ?BP: 94/69 128/74  ?Pulse:  78  ?Resp: 16 12  ?SpO2: 97% 96%  ? ? ? ?General: Awake, no distress.  ?Neuro: Cranial nerves II through XII are intact.  Visual fields are included.  Patient reports this is new.  She had no vision off to the left visual field.  Cerebellar finger-nose's and rapid alternating movements and hands are normal motor strength is 5/5 throughout patient does not report any numbness ?CV:  Good peripheral perfusion.  Heart regular rate and rhythm no audible murmurs ?Resp:  Normal effort.  Lungs are clear ?Abd:  No distention.  Abdomen soft and nontender ?  ? ? ?ED Results / Procedures / Treatments  ? ?Labs ?(all labs ordered are listed, but only abnormal results are displayed) ?Labs Reviewed  ?COMPREHENSIVE METABOLIC PANEL - Abnormal; Notable for the following components:  ?    Result Value  ? Glucose, Bld 119 (*)   ? All other components within normal limits  ?CBG MONITORING, ED - Abnormal; Notable for the following components:  ? Glucose-Capillary 117 (*)   ? All other components within normal limits  ?RESP PANEL BY RT-PCR  (FLU A&B, COVID) ARPGX2  ?PROTIME-INR  ?APTT  ?CBC  ?DIFFERENTIAL  ?LDL CHOLESTEROL, DIRECT  ?HEMOGLOBIN A1C  ? ? ? ?EKG ? ?EKG read and interpreted by me shows normal sinus rhythm at 90 left axis no acute changes but decreased R wave progression. ? ? ?RADIOLOGY ?CT head done when patient still had a visual field cut was read by radiology and the films were reviewed by me shows no acute stroke ? ? ?PROCEDURES: ? ?Critical Care performed:  ? ?Procedures ? ? ?MEDICATIONS ORDERED IN ED: ?Medications  ?aspirin chewable tablet 81 mg (has no administration in time range)  ?clopidogrel (PLAVIX) tablet 75 mg (has no administration in time range)  ?sodium chloride flush (NS) 0.9 % injection 3 mL (3 mLs Intravenous Given 05/29/21 0857)  ?LORazepam (ATIVAN) tablet 1 mg (1 mg Oral Given 05/29/21 1009)  ?gadobutrol (GADAVIST) 1 MMOL/ML injection 7 mL (7 mLs Intravenous Contrast Given 05/29/21 1128)  ? ? ? ?IMPRESSION / MDM / ASSESSMENT AND PLAN / ED  COURSE  ?I reviewed the triage vital signs and the nursing notes. ?Patient seen with neurology. ?I discussed the patient with the hospitalist.  This initially appeared to be a new stroke but then as the symptoms resolved it became apparent was more likely to be a TIA. ? ?patient is on the cardiac monitor to evaluate for evidence of arrhythmia and/or significant heart rate changes.  None were seen ? ?Patient still with multiple old strokes and now TIA.  We will get the patient in the hospital for work-up. ?  ? ? ?FINAL CLINICAL IMPRESSION(S) / ED DIAGNOSES  ? ?Final diagnoses:  ?TIA (transient ischemic attack)  ? ? ? ?Rx / DC Orders  ? ?ED Discharge Orders   ? ? None  ? ?  ? ? ? ?Note:  This document was prepared using Dragon voice recognition software and may include unintentional dictation errors. ?  ?Arnaldo Natal, MD ?05/29/21 1527 ? ?

## 2021-05-29 NOTE — Consult Note (Signed)
NEUROLOGY CONSULTATION NOTE   Date of service: May 29, 2021 Patient Name: Vicki Rosales MRN:  [id number] DOB:  06/17/1955 Reason for consult: stroke code Requesting physician: Dr. Dorothea Glassman _ _ _   _ __   _ __ _ _  __ __   _ __   __ _  History of Present Illness   This is a 66 year old woman woman with a history of cardiomyopathy, prior CVA in 2007 in 2008 without residual deficits, history of seizure, hyperlipidemia, migraines, restless leg syndrome, tobacco abuse who presents as a stroke code for complete loss of vision in left eye.  Last known well was 0705 this morning.  She had painless complete loss of vision in her left eye that was resolving by the time she arrived to the ED and completely resolved over the course of the stroke code.  By the time of the stroke code examination she had no deficits and had a NIHSS of 0.  CT head was performed which showed no acute intracranial process but did show a right frontotemporal infarct and left occipital infarct that were both chronic.  CNS imaging was personally reviewed.  Exam was not consistent with LVO therefore vascular imaging was not performed as part of the stroke code.  She is on aspirin 325 mg and Plavix 75 mg PTA 2/2 hx prior stroke.  She is not on anticoagulation.  TNK was not administered 2/2 resolution of sx.  She had a similar event 3 weeks ago in which she woke up with a severe headache on the left side and also at that time had no vision in her left eye.  These symptoms resolved and she did not seek medical attention at that time.   ROS   Per HPI: all other systems reviewed and are negative  Past History   I have reviewed the following:  Past Medical History:  Diagnosis Date   Anxiety    Cardiomyopathy (HCC)    EF of 35 %   CVA (cerebral infarction) 2007 and 2008   followed by Dr, Sherryll Burger   Depression    GERD (gastroesophageal reflux disease)    History of seizure    after her stroke   Hyperlipidemia     Migraines    ocular   RLS (restless legs syndrome)    Seizures (HCC)    Stroke (HCC)    TIA (transient ischemic attack) 2014   followed by Dr. Sherryll Burger   Tobacco abuse    Past Surgical History:  Procedure Laterality Date   CESAREAN SECTION     Family History  Problem Relation Age of Onset   Hypertension Mother    Heart attack Father    Heart disease Father        CABG   Heart disease Sister    Hyperlipidemia Sister    Hypertension Sister    Asthma Sister    Diabetes Sister    COPD Sister    Hypertension Brother    Hyperlipidemia Brother    Heart disease Maternal Grandmother        CHF   Diabetes Maternal Grandfather    Diabetes Other    Social History   Socioeconomic History   Marital status: Divorced    Spouse name: Not on file   Number of children: Not on file   Years of education: Not on file   Highest education level: Not on file  Occupational History   Not on file  Tobacco Use   Smoking  status: Every Day    Packs/day: 0.50    Years: 30.00    Pack years: 15.00    Types: Cigarettes   Smokeless tobacco: Never  Vaping Use   Vaping Use: Never used  Substance and Sexual Activity   Alcohol use: No   Drug use: No   Sexual activity: Never  Other Topics Concern   Not on file  Social History Narrative   Not on file   Social Determinants of Health   Financial Resource Strain: Not on file  Food Insecurity: Not on file  Transportation Needs: Not on file  Physical Activity: Not on file  Stress: Not on file  Social Connections: Not on file   Allergies  Allergen Reactions   Pravastatin     Muscle cramps    Medications   (Not in a hospital admission)     Current Facility-Administered Medications:    [START ON 05/30/2021] aspirin chewable tablet 81 mg, 81 mg, Oral, Daily, Jefferson Fuel, MD   [START ON 05/30/2021] clopidogrel (PLAVIX) tablet 75 mg, 75 mg, Oral, Daily, Jefferson Fuel, MD  Current Outpatient Medications:    aspirin 325 MG tablet, Take  325 mg by mouth daily., Disp: , Rfl:    clopidogrel (PLAVIX) 75 MG tablet, Take 1 tablet by mouth once daily, Disp: 30 tablet, Rfl: 12   coal tar (NEUTROGENA T-GEL) 0.5 % shampoo, Apply topically at bedtime as needed., Disp: 240 mL, Rfl: 12   losartan (COZAAR) 25 MG tablet, Take 1 tablet (25 mg total) by mouth daily., Disp: 90 tablet, Rfl: 4   rOPINIRole (REQUIP) 2 MG tablet, Take 1 tablet (2 mg total) by mouth at bedtime., Disp: 90 tablet, Rfl: 4   rosuvastatin (CRESTOR) 10 MG tablet, Take 1 tablet (10 mg total) by mouth daily., Disp: 90 tablet, Rfl: 4   sertraline (ZOLOFT) 100 MG tablet, Take 1 tablet (100 mg total) by mouth daily., Disp: 90 tablet, Rfl: 4  Vitals   Vitals:   05/29/21 0754  Weight: 63.5 kg  Height: 5' (1.524 m)     Body mass index is 27.34 kg/m.  Physical Exam   Physical Exam Gen: A&O x4, NAD HEENT: Atraumatic, normocephalic;mucous membranes moist; oropharynx clear, tongue without atrophy or fasciculations. Neck: Supple, trachea midline. Resp: CTAB, no w/r/r CV: RRR, no m/g/r; nml S1 and S2. 2+ symmetric peripheral pulses. Abd: soft/NT/ND; nabs x 4 quad Extrem: Nml bulk; no cyanosis, clubbing, or edema.  Neuro: *MS: A&O x4. Follows multi-step commands.  *Speech: fluid, nondysarthric, able to name and repeat *CN:    I: Deferred   II,III: PERRLA, VFF by confrontation, optic discs unable to be visualized 2/2 pupillary constriction   III,IV,VI: EOMI w/o nystagmus, no ptosis   V: Sensation intact from V1 to V3 to LT   VII: Eyelid closure was full.  Smile symmetric.   VIII: Hearing intact to voice   IX,X: Voice normal, palate elevates symmetrically    XI: SCM/trap 5/5 bilat   XII: Tongue protrudes midline, no atrophy or fasciculations   *Motor:   Normal bulk.  No tremor, rigidity or bradykinesia. No pronator drift.    Strength: Dlt Bic Tri WrE WrF FgS Gr HF KnF KnE PlF DoF    Left 5 5 5 5 5 5 5 5 5 5 5 5     Right 5 5 5 5 5 5 5 5 5 5 5 5     *Sensory:  Intact to light touch, pinprick, temperature vibration throughout. Symmetric. Propioception intact  bilat.  No double-simultaneous extinction.  *Coordination:  Finger-to-nose, heel-to-shin, rapid alternating motions were intact. *Reflexes:  2+ and symmetric throughout without clonus; toes down-going bilat *Gait: deferred  NIHSS = 0  Premorbid mRS = 0   Labs   CBC:  Recent Labs  Lab 05/29/21 0814  WBC 6.3  NEUTROABS 4.1  HGB 14.3  HCT 45.3  MCV 89.7  PLT 203    Basic Metabolic Panel:  Lab Results  Component Value Date   NA 140 05/29/2021   K 3.6 05/29/2021   CO2 25 05/29/2021   GLUCOSE 119 (H) 05/29/2021   BUN 14 05/29/2021   CREATININE 0.98 05/29/2021   CALCIUM 9.6 05/29/2021   GFRNONAA >60 05/29/2021   GFRAA 77 11/28/2019   Lipid Panel:  Lab Results  Component Value Date   LDLCALC 102 (H) 09/21/2020   HgbA1c:  Lab Results  Component Value Date   HGBA1C 5.9 07/07/2012   Urine Drug Screen:     Component Value Date/Time   LABOPIA NEGATIVE 07/06/2012 1151   COCAINSCRNUR NEGATIVE 07/06/2012 1151   LABBENZ NEGATIVE 07/06/2012 1151   AMPHETMU NEGATIVE 07/06/2012 1151   THCU NEGATIVE 07/06/2012 1151   LABBARB NEGATIVE 07/06/2012 1151    Alcohol Level No results found for: ETH   Impression   This is a 66 year old woman woman with a history of cardiomyopathy, prior CVA in 2007 in 2008 without residual deficits, history of seizure, hyperlipidemia, migraines, restless leg syndrome, tobacco abuse who presents as a stroke code for complete loss of vision in left eye.  Sx resolved during the stroke code therefore TNK was not administered. CT head showed NAICP. Admit 2/2 concern for TIA particularly given her hx prior strokes. Migraine is also on the differential but will be a dx exclusion.   Recommendations   - Admit to hospitalist service for stroke w/u; stroke team will consult - Permissive HTN x48 hrs from sx onset or until stroke ruled out by MRI goal BP  <220/110. PRN labetalol or hydralazine if BP above these parameters. Avoid oral antihypertensives. - MRI brain wo contrast - MRA H&N - TTE w/ bubble - Check A1c and LDL + add statin per guidelines - ASA 81mg  daily + plavix 75mg  daily - q4 hr neuro checks - STAT head CT for any change in neuro exam - Tele - PT/OT/SLP - Stroke education - Amb referral to neurology upon discharge   Will continue to follow. Stroke workup orders placed.   ______________________________________________________________________   Thank you for the opportunity to take part in the care of this patient. If you have any further questions, please contact the neurology consultation attending.  Signed,  Bing Neighbors, MD [facility] [phone]  If 7pm- 7am, please page neurology on call as listed in [facility].

## 2021-05-29 NOTE — Evaluation (Signed)
Physical Therapy Evaluation ?Patient Details ?Name: Vicki Rosales ?MRN: [id number] ?DOB: [birth year] ?Today's Date: 05/29/2021 ? ?History of Present Illness ? Pt is a 66 year old woman woman with a history of cardiomyopathy, prior CVA in 2007 in 2008 without residual deficits, history of seizure, hyperlipidemia, migraines, restless leg syndrome, tobacco abuse who presents as a stroke code for complete loss of vision in left eye.  Last known well was 0705 this morning.  She had painless complete loss of vision in her left eye that was resolving by the time she arrived to the ED and completely resolved over the course of the stroke code. ?  ?Clinical Impression ? Pt was pleasant and motivated to participate during the session and put forth good effort throughout. Pt was Ind with all functional tasks and demonstrated good control and stability throughout.  No deficits in strength, vision, sensation, or coordination noted.  No skilled PT identified.  Will complete PT orders at this time but will reassess pt pending a change in status upon receipt of new PT orders. ? ?   ?   ? ?Recommendations for follow up therapy are one component of a multi-disciplinary discharge planning process, led by the attending physician.  Recommendations may be updated based on patient status, additional functional criteria and insurance authorization. ? ?Follow Up Recommendations No PT follow up ? ?  ?Assistance Recommended at Discharge None  ?Patient can return home with the following ? Assist for transportation ? ?  ?Equipment Recommendations None recommended by PT  ?Recommendations for Other Services ?    ?  ?Functional Status Assessment Patient has not had a recent decline in their functional status  ? ?  ?Precautions / Restrictions Precautions ?Precautions: None ?Restrictions ?Weight Bearing Restrictions: No  ? ?  ? ?Mobility ? Bed Mobility ?Overal bed mobility: Independent ?  ?  ?  ?  ?  ?  ?  ?  ? ?Transfers ?Overall transfer level:  Independent ?Equipment used: None ?  ?  ?  ?  ?  ?  ?  ?General transfer comment: Good eccentric and concentric control and stability ?  ? ?Ambulation/Gait ?Ambulation/Gait assistance: Independent ?Gait Distance (Feet): 200 Feet ?Assistive device: None ?Gait Pattern/deviations: WFL(Within Functional Limits) ?Gait velocity: WNL ?  ?  ?General Gait Details: Pt steady with good cadence without an AD including during start/stops, 180 deg rapid turns, and with head turns left/right and up/down ? ?Stairs ?  ?  ?  ?  ?  ? ?Wheelchair Mobility ?  ? ?Modified Rankin (Stroke Patients Only) ?  ? ?  ? ?Balance Overall balance assessment: No apparent balance deficits (not formally assessed) ?  ?  ?  ?  ?  ?  ?  ?  ?  ?  ?  ?  ?  ?  ?  ?  ?  ?  ?   ? ? ? ?Pertinent Vitals/Pain Pain Assessment ?Pain Assessment: No/denies pain  ? ? ?Home Living Family/patient expects to be discharged to:: Private residence ?Living Arrangements: Parent ?Available Help at Discharge: Family;Available PRN/intermittently ?Type of Home: House ?Home Access: Stairs to enter ?  ?Entrance Stairs-Number of Steps: 1 ?  ?Home Layout: One level ?Home Equipment: Shower seat;Cane - single point ?   ?  ?Prior Function Prior Level of Function : Independent/Modified Independent ?  ?  ?  ?  ?  ?  ?Mobility Comments: Ind amb community distances without AD, no fall history, lives with and cares for her mother at  home with errands/housework ?ADLs Comments: Ind with ADLs ?  ? ? ?Hand Dominance  ? Dominant Hand: Right ? ?  ?Extremity/Trunk Assessment  ? Upper Extremity Assessment ?Upper Extremity Assessment: Overall WFL for tasks assessed ?  ? ?Lower Extremity Assessment ?Lower Extremity Assessment: Overall WFL for tasks assessed ?  ? ?Cervical / Trunk Assessment ?Cervical / Trunk Assessment: Normal  ?Communication  ? Communication: No difficulties  ?Cognition Arousal/Alertness: Awake/alert ?Behavior During Therapy: [facility] for tasks assessed/performed ?Overall Cognitive  Status: Within Functional Limits for tasks assessed ?  ?  ?  ?  ?  ?  ?  ?  ?  ?  ?  ?  ?  ?  ?  ?  ?  ?  ?  ? ?  ?General Comments   ? ?  ?Exercises    ? ?Assessment/Plan  ?  ?PT Assessment Patient does not need any further PT services  ?PT Problem List   ? ?   ?  ?PT Treatment Interventions     ? ?PT Goals (Current goals can be found in the Care Plan section)  ?Acute Rehab PT Goals ?PT Goal Formulation: All assessment and education complete, DC therapy ? ?  ?Frequency   ?  ? ? ?Co-evaluation   ?  ?  ?  ?  ? ? ?  ?AM-PAC PT "6 Clicks" Mobility  ?Outcome Measure Help needed turning from your back to your side while in a flat bed without using bedrails?: None ?Help needed moving from lying on your back to sitting on the side of a flat bed without using bedrails?: None ?Help needed moving to and from a bed to a chair (including a wheelchair)?: None ?Help needed standing up from a chair using your arms (e.g., wheelchair or bedside chair)?: None ?Help needed to walk in hospital room?: None ?Help needed climbing 3-5 steps with a railing? : None ?6 Click Score: 24 ? ?  ?End of Session Equipment Utilized During Treatment: Gait belt ?Activity Tolerance: Patient tolerated treatment well ?Patient left: in bed;with call bell/phone within reach;with family/visitor present ?Nurse Communication: Mobility status ?PT Visit Diagnosis: Difficulty in walking, not elsewhere classified (R26.2) ?  ? ?Time: 2992-4268 ?PT Time Calculation (min) (ACUTE ONLY): 22 min ? ? ?Charges:   PT Evaluation ?$PT Eval Low Complexity: 1 Low ?  ?  ?   ? ? ?D. Elly Modena PT, DPT ?05/29/21, 2:56 PM ? ? ?

## 2021-05-29 NOTE — Progress Notes (Signed)
OT Cancellation Note ? ?Patient Details ?Name: Vicki Rosales ?MRN: [id number] ?DOB: 1956/03/29 ? ? ?Cancelled Treatment:    Reason Eval/Treat Not Completed: OT screened, no needs identified, will sign off. Order received, chart reviewed. Per PT, pt back to baseline functional independence. No skilled OT needs identified. Will sign off. Please re-consult if additional needs arise.  ? ?Kathie Dike, M.S. OTR/L  ?05/29/21, 4:09 PM  ?ascom [phone] ? ?

## 2021-05-29 NOTE — Progress Notes (Signed)
Lunch tray ordered for patient.

## 2021-05-29 NOTE — Progress Notes (Signed)
?   05/29/21 1300  ?Clinical Encounter Type  ?Visited With Patient and family together  ?Visit Type Initial  ?Referral From Nurse  ?Consult/Referral To Chaplain  ? ?Chaplain responded to code stroke. Chaplain provided emotional and spiritual support to patient and daughter. Patient appreciated Chaplain visit. ?

## 2021-05-30 ENCOUNTER — Observation Stay (HOSPITAL_BASED_OUTPATIENT_CLINIC_OR_DEPARTMENT_OTHER)
Admit: 2021-05-30 | Discharge: 2021-05-30 | Disposition: A | Discharge status: Home / Self Care | Payer: Self-pay | Attending: Internal Medicine | Admitting: Internal Medicine

## 2021-05-30 DIAGNOSIS — G459 Transient cerebral ischemic attack, unspecified: Secondary | ICD-10-CM

## 2021-05-30 LAB — ECHOCARDIOGRAM COMPLETE BUBBLE STUDY
AR max vel: 1.75 cm2
AV Area VTI: 1.77 cm2
AV Area mean vel: 1.7 cm2
AV Mean grad: 4 mmHg
AV Peak grad: 7.4 mmHg
Ao pk vel: 1.36 m/s
Area-P 1/2: 5.31 cm2
MV VTI: 2.36 cm2
S' Lateral: 3.73 cm

## 2021-05-30 LAB — HIV ANTIBODY (ROUTINE TESTING W REFLEX): HIV Screen 4th Generation wRfx: NONREACTIVE

## 2021-05-30 MED ORDER — ASPIRIN 81 MG PO CHEW: 81.0000 mg | CHEWABLE_TABLET | Freq: Every day | ORAL | 1 refills | Status: DC

## 2021-05-30 NOTE — Progress Notes (Signed)
*  PRELIMINARY RESULTS* ?Echocardiogram ?2D Echocardiogram has been performed. ? ?Vicki Rosales Vicki Rosales ?05/30/2021, 11:59 AM ?

## 2021-05-30 NOTE — Discharge Summary (Addendum)
Physician Discharge Summary  Vicki Rosales [id number] DOB: Oct 08, 1955 DOA: 05/29/2021  PCP: Venita Lick, NP  Admit date: 05/29/2021 Discharge date: 05/30/2021  Admitted From: Home Disposition:  Home  Discharge Condition:Stable CODE STATUS:FULL Diet recommendation: Heart Healthy    Brief/Interim Summary:  Vicki Rosales is a 66 y.o. female with medical history significant for history of stroke (x2 in 2007 and 2008), seizure, cardiomyopathy, hyperlipidemia, migraine, restless leg syndrome, who presented to the hospital because of loss of vision in the left eye.  She said she noticed the changes in her vision earlier this morning when she was getting ready to go to work.  It lasted about 10 minutes.  She decided that she will try and go to work.  However, loss of vision in the left eye recurred.  She decided to go to the emergency room for further evaluation.  By the time she got to the emergency room, vision in the left eye had already been restarted.  She had no other symptoms.  No headache, dizziness, confusion, changes in speech, unsteady gait, weakness, tingling or numbness in the extremities. Patient was admitted for the suspicion of stroke.  Stroke work-up initiated.  Neurology consulted.  MRI of the brain, MRA head and neck did not show any acute findings, no large vessel disease.  But showed chronic ischemia with old infarcts.  She was seen by PT/OT and recommended no follow-up.  She is medically stable for discharge home today.    Following problems were addressed during her hospitalization:  Temporary loss of vision in the left eye/left visual field cut in a patient with history of stroke:  Vision in the left eye has been restored.  MRI brain, head and neck did not show any acute abnormality or large vessel occlusion.  No evidence of stroke.  Patient is already  evaluated by the neurologist.    Continue rosuvastatin, aspirin and Plavix.  Seen by PT/OT, no follow-up  recommended Echo showed EF of 40 to 45%. We recommend to follow-up with ophthalmology as an outpatient.  She needs to follow-up with her neurologist.  History of systolic congestive heart failure: Currently euvolemic.  Echo done here showed EF of 40 to 45%.  Echo done in 2014 had shown EF of 30 to 35%.  She was following with Dr. Rockey Situ in the past.  We recommend to follow-up with her cardiologist as an outpatient   Hypertension: Continue losartan   Depression and anxiety: Continue sertraline  Other comorbidities include hyperlipidemia, GERD, restless leg syndrome   Discharge Diagnoses:  Principal Problem:   Visual field cut    Discharge Instructions  Discharge Instructions     Ambulatory referral to Neurology   Complete by: As directed    Diet - low sodium heart healthy   Complete by: As directed    Discharge instructions   Complete by: As directed    1)Please continue your home medications 2)Follow up with your PCP,neurologist and cardiologist 3)Please follow-up with ophthalmology as an outpatient for the evaluation of your vision loss   Increase activity slowly   Complete by: As directed       Allergies as of 05/30/2021       Reactions   Pravastatin    Muscle cramps        Medication List     STOP taking these medications    aspirin 325 MG tablet Replaced by: aspirin 81 MG chewable tablet       TAKE these medications  aspirin 81 MG chewable tablet Chew 1 tablet (81 mg total) by mouth daily. Start taking on: May 31, 2021 Replaces: aspirin 325 MG tablet   clopidogrel 75 MG tablet Commonly known as: PLAVIX Take 1 tablet by mouth once daily   coal tar 0.5 % shampoo Commonly known as: NEUTROGENA T-GEL Apply topically at bedtime as needed.   losartan 25 MG tablet Commonly known as: COZAAR Take 1 tablet (25 mg total) by mouth daily.   rOPINIRole 2 MG tablet Commonly known as: REQUIP Take 1 tablet (2 mg total) by mouth at bedtime.    rosuvastatin 10 MG tablet Commonly known as: Crestor Take 1 tablet (10 mg total) by mouth daily.   sertraline 100 MG tablet Commonly known as: ZOLOFT Take 1 tablet (100 mg total) by mouth daily.        Allergies  Allergen Reactions   Pravastatin     Muscle cramps    Consultations: Neurology  Procedures/Studies: MR ANGIO HEAD WO CONTRAST  Result Date: 05/29/2021 CLINICAL DATA:  Neuro deficit, acute, stroke suspected. Left-sided vision loss. EXAM: MRI HEAD WITHOUT CONTRAST MRA HEAD WITHOUT CONTRAST MRA OF THE NECK WITHOUT AND WITH CONTRAST TECHNIQUE: Multiplanar, multi-echo pulse sequences of the brain and surrounding structures were acquired without intravenous contrast. Angiographic images of the Circle of Willis were acquired using MRA technique without intravenous contrast. Angiographic images of the neck were acquired using MRA technique without and with intravenous contrast. Carotid stenosis measurements (when applicable) are obtained utilizing NASCET criteria, using the distal internal carotid diameter as the denominator. CONTRAST:  36mL GADAVIST GADOBUTROL 1 MMOL/ML IV SOLN COMPARISON:  Head CT 05/29/2021. Head MRI, head MRA, and neck MRA 07/07/2012. FINDINGS: MR HEAD FINDINGS Brain: There is no evidence of an acute infarct, mass, midline shift, or extra-axial fluid collection. Chronic infarcts involving the right frontal lobe primarily at the level of the operculum, right insula, right corona radiata, and left occipital lobe are unchanged from the prior MRI, and associated chronic blood products are noted. A small chronic right occipital infarct is new. Small chronic infarcts involving the left parietal lobe and right caudate body are unchanged from the prior MRI, while chronic bilateral cerebellar infarcts have increased in number. Moderate patchy T2 hyperintensity in the pons has mildly progressed and is nonspecific but compatible with chronic small vessel ischemia. Mild cerebral  atrophy is within normal limits for age. Vascular: Major intracranial vascular flow voids are preserved. Skull and upper cervical spine: Unremarkable bone marrow signal. Sinuses/Orbits: Unremarkable orbits. Paranasal sinuses and mastoid air cells are clear. Other: None. MRA HEAD FINDINGS Anterior circulation: The internal carotid arteries are widely patent from skull base to carotid termini. ACAs and MCAs are patent without evidence of a proximal branch occlusion or significant proximal stenosis. No aneurysm is identified. Posterior circulation: The included portions of the intracranial vertebral arteries are widely patent to the basilar with the left being dominant. Patent PICA, AICA, and SCA origins are seen bilaterally. The basilar artery is widely patent. Posterior communicating arteries are diminutive or absent. Both PCAs are patent without evidence of a significant proximal stenosis. No aneurysm is identified. Anatomic variants:None. MRA NECK FINDINGS Aortic arch: Standard 3 vessel aortic arch. Widely patent brachiocephalic and subclavian arteries. Right carotid system: Patent without evidence of significant common or internal carotid artery stenosis. Left carotid system: Patent without evidence of significant common or internal carotid artery stenosis. Vertebral arteries: Patent with antegrade flow bilaterally. Moderately dominant left vertebral artery. No evidence of a significant stenosis  or dissection. IMPRESSION: 1. No acute intracranial abnormality. 2. Chronic ischemia with multiple old infarcts as above, progressed from 2014. 3. Negative head MRA. 4. Negative neck MRA. Electronically Signed   By: Logan Bores M.D.   On: 05/29/2021 12:29   MR ANGIO NECK W WO CONTRAST  Result Date: 05/29/2021 CLINICAL DATA:  Neuro deficit, acute, stroke suspected. Left-sided vision loss. EXAM: MRI HEAD WITHOUT CONTRAST MRA HEAD WITHOUT CONTRAST MRA OF THE NECK WITHOUT AND WITH CONTRAST TECHNIQUE: Multiplanar,  multi-echo pulse sequences of the brain and surrounding structures were acquired without intravenous contrast. Angiographic images of the Circle of Willis were acquired using MRA technique without intravenous contrast. Angiographic images of the neck were acquired using MRA technique without and with intravenous contrast. Carotid stenosis measurements (when applicable) are obtained utilizing NASCET criteria, using the distal internal carotid diameter as the denominator. CONTRAST:  57mL GADAVIST GADOBUTROL 1 MMOL/ML IV SOLN COMPARISON:  Head CT 05/29/2021. Head MRI, head MRA, and neck MRA 07/07/2012. FINDINGS: MR HEAD FINDINGS Brain: There is no evidence of an acute infarct, mass, midline shift, or extra-axial fluid collection. Chronic infarcts involving the right frontal lobe primarily at the level of the operculum, right insula, right corona radiata, and left occipital lobe are unchanged from the prior MRI, and associated chronic blood products are noted. A small chronic right occipital infarct is new. Small chronic infarcts involving the left parietal lobe and right caudate body are unchanged from the prior MRI, while chronic bilateral cerebellar infarcts have increased in number. Moderate patchy T2 hyperintensity in the pons has mildly progressed and is nonspecific but compatible with chronic small vessel ischemia. Mild cerebral atrophy is within normal limits for age. Vascular: Major intracranial vascular flow voids are preserved. Skull and upper cervical spine: Unremarkable bone marrow signal. Sinuses/Orbits: Unremarkable orbits. Paranasal sinuses and mastoid air cells are clear. Other: None. MRA HEAD FINDINGS Anterior circulation: The internal carotid arteries are widely patent from skull base to carotid termini. ACAs and MCAs are patent without evidence of a proximal branch occlusion or significant proximal stenosis. No aneurysm is identified. Posterior circulation: The included portions of the intracranial  vertebral arteries are widely patent to the basilar with the left being dominant. Patent PICA, AICA, and SCA origins are seen bilaterally. The basilar artery is widely patent. Posterior communicating arteries are diminutive or absent. Both PCAs are patent without evidence of a significant proximal stenosis. No aneurysm is identified. Anatomic variants:None. MRA NECK FINDINGS Aortic arch: Standard 3 vessel aortic arch. Widely patent brachiocephalic and subclavian arteries. Right carotid system: Patent without evidence of significant common or internal carotid artery stenosis. Left carotid system: Patent without evidence of significant common or internal carotid artery stenosis. Vertebral arteries: Patent with antegrade flow bilaterally. Moderately dominant left vertebral artery. No evidence of a significant stenosis or dissection. IMPRESSION: 1. No acute intracranial abnormality. 2. Chronic ischemia with multiple old infarcts as above, progressed from 2014. 3. Negative head MRA. 4. Negative neck MRA. Electronically Signed   By: Logan Bores M.D.   On: 05/29/2021 12:29   MR BRAIN WO CONTRAST  Result Date: 05/29/2021 CLINICAL DATA:  Neuro deficit, acute, stroke suspected. Left-sided vision loss. EXAM: MRI HEAD WITHOUT CONTRAST MRA HEAD WITHOUT CONTRAST MRA OF THE NECK WITHOUT AND WITH CONTRAST TECHNIQUE: Multiplanar, multi-echo pulse sequences of the brain and surrounding structures were acquired without intravenous contrast. Angiographic images of the Circle of Willis were acquired using MRA technique without intravenous contrast. Angiographic images of the neck were acquired using  MRA technique without and with intravenous contrast. Carotid stenosis measurements (when applicable) are obtained utilizing NASCET criteria, using the distal internal carotid diameter as the denominator. CONTRAST:  42mL GADAVIST GADOBUTROL 1 MMOL/ML IV SOLN COMPARISON:  Head CT 05/29/2021. Head MRI, head MRA, and neck MRA 07/07/2012.  FINDINGS: MR HEAD FINDINGS Brain: There is no evidence of an acute infarct, mass, midline shift, or extra-axial fluid collection. Chronic infarcts involving the right frontal lobe primarily at the level of the operculum, right insula, right corona radiata, and left occipital lobe are unchanged from the prior MRI, and associated chronic blood products are noted. A small chronic right occipital infarct is new. Small chronic infarcts involving the left parietal lobe and right caudate body are unchanged from the prior MRI, while chronic bilateral cerebellar infarcts have increased in number. Moderate patchy T2 hyperintensity in the pons has mildly progressed and is nonspecific but compatible with chronic small vessel ischemia. Mild cerebral atrophy is within normal limits for age. Vascular: Major intracranial vascular flow voids are preserved. Skull and upper cervical spine: Unremarkable bone marrow signal. Sinuses/Orbits: Unremarkable orbits. Paranasal sinuses and mastoid air cells are clear. Other: None. MRA HEAD FINDINGS Anterior circulation: The internal carotid arteries are widely patent from skull base to carotid termini. ACAs and MCAs are patent without evidence of a proximal branch occlusion or significant proximal stenosis. No aneurysm is identified. Posterior circulation: The included portions of the intracranial vertebral arteries are widely patent to the basilar with the left being dominant. Patent PICA, AICA, and SCA origins are seen bilaterally. The basilar artery is widely patent. Posterior communicating arteries are diminutive or absent. Both PCAs are patent without evidence of a significant proximal stenosis. No aneurysm is identified. Anatomic variants:None. MRA NECK FINDINGS Aortic arch: Standard 3 vessel aortic arch. Widely patent brachiocephalic and subclavian arteries. Right carotid system: Patent without evidence of significant common or internal carotid artery stenosis. Left carotid system:  Patent without evidence of significant common or internal carotid artery stenosis. Vertebral arteries: Patent with antegrade flow bilaterally. Moderately dominant left vertebral artery. No evidence of a significant stenosis or dissection. IMPRESSION: 1. No acute intracranial abnormality. 2. Chronic ischemia with multiple old infarcts as above, progressed from 2014. 3. Negative head MRA. 4. Negative neck MRA. Electronically Signed   By: Logan Bores M.D.   On: 05/29/2021 12:29   ECHOCARDIOGRAM COMPLETE BUBBLE STUDY  Result Date: 05/30/2021    ECHOCARDIOGRAM REPORT   Patient Name:   Vicki Rosales Date of Exam: 05/30/2021 Medical Rec #:  [id number]          Height:       60.0 in Accession #:    [id number]         Weight:       140.0 lb Date of Birth:  [birth year]          BSA:          1.604 m Patient Age:    67 years           BP:           146/87 mmHg Patient Gender: F                  HR:           80 bpm. Exam Location:  [facility] Procedure: 2D Echo, Color Doppler, Cardiac Doppler and Saline Contrast Bubble            Study Indications:     G45.9 TIA  History:         Patient has no prior history of Echocardiogram examinations.                  Stroke; Risk Factors:Current Smoker and Dyslipidemia.  Sonographer:     Charmayne Sheer Referring Phys:  [id number] Jennye Boroughs Diagnosing Phys: Ida Rogue MD IMPRESSIONS  1. Left ventricular ejection fraction, by estimation, is 40 to 45%. The left ventricle has mildly decreased function. The left ventricle demonstrates global hypokinesis. Left ventricular diastolic parameters are consistent with Grade I diastolic dysfunction (impaired relaxation).  2. Right ventricular systolic function is normal. The right ventricular size is normal.  3. The mitral valve is normal in structure. Mild to moderate mitral valve regurgitation. No evidence of mitral stenosis.  4. The aortic valve is normal in structure. Aortic valve regurgitation is not visualized. No aortic stenosis is present.  5.  The inferior vena cava is normal in size with greater than 50% respiratory variability, suggesting right atrial pressure of 3 mmHg.  6. Agitated saline contrast bubble study was negative, with no evidence of any interatrial shunt. FINDINGS  Left Ventricle: Left ventricular ejection fraction, by estimation, is 40 to 45%. The left ventricle has mildly decreased function. The left ventricle demonstrates global hypokinesis. The left ventricular internal cavity size was normal in size. There is  no left ventricular hypertrophy. Left ventricular diastolic parameters are consistent with Grade I diastolic dysfunction (impaired relaxation). Right Ventricle: The right ventricular size is normal. No increase in right ventricular wall thickness. Right ventricular systolic function is normal. Left Atrium: Left atrial size was normal in size. Right Atrium: Right atrial size was normal in size. Pericardium: There is no evidence of pericardial effusion. Mitral Valve: The mitral valve is normal in structure. Mild to moderate mitral valve regurgitation. No evidence of mitral valve stenosis. MV peak gradient, 3.8 mmHg. The mean mitral valve gradient is 1.0 mmHg. Tricuspid Valve: The tricuspid valve is normal in structure. Tricuspid valve regurgitation is not demonstrated. No evidence of tricuspid stenosis. Aortic Valve: The aortic valve is normal in structure. Aortic valve regurgitation is not visualized. No aortic stenosis is present. Aortic valve mean gradient measures 4.0 mmHg. Aortic valve peak gradient measures 7.4 mmHg. Aortic valve area, by VTI measures 1.77 cm. Pulmonic Valve: The pulmonic valve was normal in structure. Pulmonic valve regurgitation is not visualized. No evidence of pulmonic stenosis. Aorta: The aortic root is normal in size and structure. Venous: The inferior vena cava is normal in size with greater than 50% respiratory variability, suggesting right atrial pressure of 3 mmHg. IAS/Shunts: No atrial level shunt  detected by color flow Doppler. Agitated saline contrast was given intravenously to evaluate for intracardiac shunting. Agitated saline contrast bubble study was negative, with no evidence of any interatrial shunt.  LEFT VENTRICLE PLAX 2D LVIDd:         4.79 cm   Diastology LVIDs:         3.73 cm   LV e' medial:    4.13 cm/s LV PW:         0.76 cm   LV E/e' medial:  10.4 LV IVS:        0.69 cm   LV e' lateral:   7.29 cm/s LVOT diam:     1.80 cm   LV E/e' lateral: 5.9 LV SV:         38 LV SV Index:   23 LVOT Area:     2.54 cm  RIGHT  VENTRICLE RV Basal diam:  2.99 cm LEFT ATRIUM             Index        RIGHT ATRIUM           Index LA diam:        3.50 cm 2.18 cm/m   RA Area:     14.10 cm LA Vol (A2C):   21.4 ml 13.34 ml/m  RA Volume:   33.80 ml  21.07 ml/m LA Vol (A4C):   29.7 ml 18.52 ml/m LA Biplane Vol: 28.0 ml 17.46 ml/m  AORTIC VALVE                    PULMONIC VALVE AV Area (Vmax):    1.75 cm     PV Vmax:       0.85 m/s AV Area (Vmean):   1.70 cm     PV Vmean:      58.900 cm/s AV Area (VTI):     1.77 cm     PV VTI:        0.150 m AV Vmax:           136.00 cm/s  PV Peak grad:  2.9 mmHg AV Vmean:          95.700 cm/s  PV Mean grad:  2.0 mmHg AV VTI:            0.213 m AV Peak Grad:      7.4 mmHg AV Mean Grad:      4.0 mmHg LVOT Vmax:         93.30 cm/s LVOT Vmean:        63.900 cm/s LVOT VTI:          0.148 m LVOT/AV VTI ratio: 0.69  AORTA Ao Root diam: 2.60 cm MITRAL VALVE MV Area (PHT): 5.31 cm    SHUNTS MV Area VTI:   2.36 cm    Systemic VTI:  0.15 m MV Peak grad:  3.8 mmHg    Systemic Diam: 1.80 cm MV Mean grad:  1.0 mmHg MV Vmax:       0.97 m/s MV Vmean:      44.4 cm/s MV Decel Time: 143 msec MV E velocity: 42.80 cm/s MV A velocity: 93.00 cm/s MV E/A ratio:  0.46 Ida Rogue MD Electronically signed by Ida Rogue MD Signature Date/Time: 05/30/2021/1:11:02 PM    Final    CT HEAD CODE STROKE WO CONTRAST`  Result Date: 05/29/2021 CLINICAL DATA:  Code stroke.  Visual disturbance EXAM: CT HEAD  WITHOUT CONTRAST TECHNIQUE: Contiguous axial images were obtained from the base of the skull through the vertex without intravenous contrast. RADIATION DOSE REDUCTION: This exam was performed according to the departmental dose-optimization program which includes automated exposure control, adjustment of the mA and/or kV according to patient size and/or use of iterative reconstruction technique. COMPARISON:  2014 FINDINGS: Brain: There is no acute intracranial hemorrhage, mass effect, or edema. No acute appearing loss of gray-white differentiation. There are chronic infarcts of the right frontal opercular region and insula, left occipital lobe, bilateral cerebellar hemispheres, and right centrum semiovale. Additional patchy hypoattenuation in the supratentorial white matter may reflect chronic microvascular ischemic changes. There is no hydrocephalus or extra-axial collection. Vascular: No hyperdense vessel. Skull: Unremarkable. Sinuses/Orbits: No acute abnormality. Other: Mastoid air cells are clear. ASPECTS [facility] Stroke Program Early CT Score) - Ganglionic level infarction (caudate, lentiform nuclei, internal capsule, insula, M1-M3 cortex): 7 - Supraganglionic infarction (M4-M6 cortex): 3  Total score (0-10 with 10 being normal): 10 IMPRESSION: There is no acute intracranial hemorrhage or evidence of acute infarction. ASPECT score is 10. Chronic findings detailed above. These results were communicated to Dr. Quinn Axe at 8:35 am on 05/29/2021 by text page via the [facility] messaging system. Electronically Signed   By: Macy Mis M.D.   On: 05/29/2021 08:37      Subjective: Patient seen and examined at the bedside this morning.  Hemodynamically stable for discharge today  Discharge Exam: Vitals:   05/30/21 0854 05/30/21 1202  BP: (!) 146/87 127/83  Pulse: 85 89  Resp: 20 20  Temp: 97.9 F (36.6 C) 97.8 F (36.6 C)  SpO2: 96% 95%   Vitals:   05/30/21 0445 05/30/21 0502 05/30/21 0854 05/30/21 1202  BP:   (!) 143/73 (!) 146/87 127/83  Pulse:  79 85 89  Resp: 20 18 20 20   Temp:  97.7 F (36.5 C) 97.9 F (36.6 C) 97.8 F (36.6 C)  TempSrc:  Oral    SpO2:  92% 96% 95%  Weight:      Height:        General: Pt is alert, awake, not in acute distress Cardiovascular: RRR, S1/S2 +, no rubs, no gallops Respiratory: CTA bilaterally, no wheezing, no rhonchi Abdominal: Soft, NT, ND, bowel sounds + Extremities: no edema, no cyanosis    The results of significant diagnostics from this hospitalization (including imaging, microbiology, ancillary and laboratory) are listed below for reference.     Microbiology: Recent Results (from the past 240 hour(s))  Resp Panel by RT-PCR (Flu A&B, Covid) Nasopharyngeal Swab     Status: None   Collection Time: 05/29/21  8:48 AM   Specimen: Nasopharyngeal Swab; Nasopharyngeal(NP) swabs in vial transport medium  Result Value Ref Range Status   SARS Coronavirus 2 by RT PCR NEGATIVE NEGATIVE Final    Comment: (NOTE) SARS-CoV-2 target nucleic acids are NOT DETECTED.  The SARS-CoV-2 RNA is generally detectable in upper respiratory specimens during the acute phase of infection. The lowest concentration of SARS-CoV-2 viral copies this assay can detect is 138 copies/mL. A negative result does not preclude SARS-Cov-2 infection and should not be used as the sole basis for treatment or other patient management decisions. A negative result may occur with  improper specimen collection/handling, submission of specimen other than nasopharyngeal swab, presence of viral mutation(s) within the areas targeted by this assay, and inadequate number of viral copies(<138 copies/mL). A negative result must be combined with clinical observations, patient history, and epidemiological information. The expected result is Negative.  Fact Sheet for Patients:  EntrepreneurPulse.com.au  Fact Sheet for Healthcare Providers:   IncredibleEmployment.be  This test is no t yet approved or cleared by the [place] FDA and  has been authorized for detection and/or diagnosis of SARS-CoV-2 by FDA under an Emergency Use Authorization (EUA). This EUA will remain  in effect (meaning this test can be used) for the duration of the COVID-19 declaration under Section 564(b)(1) of the Act, 21 U.S.C.section 360bbb-3(b)(1), unless the authorization is terminated  or revoked sooner.       Influenza A by PCR NEGATIVE NEGATIVE Final   Influenza B by PCR NEGATIVE NEGATIVE Final    Comment: (NOTE) The Xpert Xpress SARS-CoV-2/FLU/RSV plus assay is intended as an aid in the diagnosis of influenza from Nasopharyngeal swab specimens and should not be used as a sole basis for treatment. Nasal washings and aspirates are unacceptable for Xpert Xpress SARS-CoV-2/FLU/RSV testing.  Fact Sheet for  Patients: EntrepreneurPulse.com.au  Fact Sheet for Healthcare Providers: IncredibleEmployment.be  This test is not yet approved or cleared by the [place] FDA and has been authorized for detection and/or diagnosis of SARS-CoV-2 by FDA under an Emergency Use Authorization (EUA). This EUA will remain in effect (meaning this test can be used) for the duration of the COVID-19 declaration under Section 564(b)(1) of the Act, 21 U.S.C. section 360bbb-3(b)(1), unless the authorization is terminated or revoked.  Performed at [facility], [address] [place], [place] [address]      Labs: BNP (last 3 results) No results for input(s): BNP in the last 8760 hours. Basic Metabolic Panel: Recent Labs  Lab 05/29/21 0814  NA 140  K 3.6  CL 104  CO2 25  GLUCOSE 119*  BUN 14  CREATININE 0.98  CALCIUM 9.6   Liver Function Tests: Recent Labs  Lab 05/29/21 0814  AST 18  ALT 11  ALKPHOS 84  BILITOT 0.5  PROT 7.8  ALBUMIN 4.1   No results for input(s): LIPASE,  AMYLASE in the last 168 hours. No results for input(s): AMMONIA in the last 168 hours. CBC: Recent Labs  Lab 05/29/21 0814  WBC 6.3  NEUTROABS 4.1  HGB 14.3  HCT 45.3  MCV 89.7  PLT 203   Cardiac Enzymes: No results for input(s): CKTOTAL, CKMB, CKMBINDEX, TROPONINI in the last 168 hours. BNP: Invalid input(s): POCBNP CBG: Recent Labs  Lab 05/29/21 0807  GLUCAP 117*   D-Dimer No results for input(s): DDIMER in the last 72 hours. Hgb A1c Recent Labs    05/29/21 0814  HGBA1C 6.0*   Lipid Profile Recent Labs    05/29/21 0814  LDLDIRECT 83.1   Thyroid function studies No results for input(s): TSH, T4TOTAL, T3FREE, THYROIDAB in the last 72 hours.  Invalid input(s): FREET3 Anemia work up No results for input(s): VITAMINB12, FOLATE, FERRITIN, TIBC, IRON, RETICCTPCT in the last 72 hours. Urinalysis    Component Value Date/Time   COLORURINE YELLOW (A) 01/01/2017 1954   APPEARANCEUR CLEAR (A) 01/01/2017 1954   APPEARANCEUR Clear 07/06/2012 1151   LABSPEC 1.012 01/01/2017 1954   LABSPEC 1.003 07/06/2012 1151   PHURINE 6.0 01/01/2017 1954   GLUCOSEU NEGATIVE 01/01/2017 1954   GLUCOSEU Negative 07/06/2012 1151   HGBUR SMALL (A) 01/01/2017 [place] NEGATIVE 01/01/2017 1954   BILIRUBINUR Negative 07/06/2012 1151   KETONESUR 20 (A) 01/01/2017 [place] NEGATIVE 01/01/2017 1954   NITRITE NEGATIVE 01/01/2017 1954   LEUKOCYTESUR SMALL (A) 01/01/2017 1954   LEUKOCYTESUR Negative 07/06/2012 1151   Sepsis Labs Invalid input(s): PROCALCITONIN,  WBC,  LACTICIDVEN Microbiology Recent Results (from the past 240 hour(s))  Resp Panel by RT-PCR (Flu A&B, Covid) Nasopharyngeal Swab     Status: None   Collection Time: 05/29/21  8:48 AM   Specimen: Nasopharyngeal Swab; Nasopharyngeal(NP) swabs in vial transport medium  Result Value Ref Range Status   SARS Coronavirus 2 by RT PCR NEGATIVE NEGATIVE Final    Comment: (NOTE) SARS-CoV-2 target nucleic acids are NOT  DETECTED.  The SARS-CoV-2 RNA is generally detectable in upper respiratory specimens during the acute phase of infection. The lowest concentration of SARS-CoV-2 viral copies this assay can detect is 138 copies/mL. A negative result does not preclude SARS-Cov-2 infection and should not be used as the sole basis for treatment or other patient management decisions. A negative result may occur with  improper specimen collection/handling, submission of specimen other than nasopharyngeal swab, presence of viral mutation(s) within  the areas targeted by this assay, and inadequate number of viral copies(<138 copies/mL). A negative result must be combined with clinical observations, patient history, and epidemiological information. The expected result is Negative.  Fact Sheet for Patients:  EntrepreneurPulse.com.au  Fact Sheet for Healthcare Providers:  IncredibleEmployment.be  This test is no t yet approved or cleared by the [place] FDA and  has been authorized for detection and/or diagnosis of SARS-CoV-2 by FDA under an Emergency Use Authorization (EUA). This EUA will remain  in effect (meaning this test can be used) for the duration of the COVID-19 declaration under Section 564(b)(1) of the Act, 21 U.S.C.section 360bbb-3(b)(1), unless the authorization is terminated  or revoked sooner.       Influenza A by PCR NEGATIVE NEGATIVE Final   Influenza B by PCR NEGATIVE NEGATIVE Final    Comment: (NOTE) The Xpert Xpress SARS-CoV-2/FLU/RSV plus assay is intended as an aid in the diagnosis of influenza from Nasopharyngeal swab specimens and should not be used as a sole basis for treatment. Nasal washings and aspirates are unacceptable for Xpert Xpress SARS-CoV-2/FLU/RSV testing.  Fact Sheet for Patients: EntrepreneurPulse.com.au  Fact Sheet for Healthcare Providers: IncredibleEmployment.be  This test is not yet  approved or cleared by the [place] FDA and has been authorized for detection and/or diagnosis of SARS-CoV-2 by FDA under an Emergency Use Authorization (EUA). This EUA will remain in effect (meaning this test can be used) for the duration of the COVID-19 declaration under Section 564(b)(1) of the Act, 21 U.S.C. section 360bbb-3(b)(1), unless the authorization is terminated or revoked.  Performed at [facility], [address] [place], [place] [address]     Please note: You were cared for by a hospitalist during your hospital stay. Once you are discharged, your primary care physician will handle any further medical issues. Please note that NO REFILLS for any discharge medications will be authorized once you are discharged, as it is imperative that you return to your primary care physician (or establish a relationship with a primary care physician if you do not have one) for your post hospital discharge needs so that they can reassess your need for medications and monitor your lab values.    Time coordinating discharge: 40 minutes  SIGNED:   Shelly Coss, MD  [facility] 05/30/2021, 1:58 PM Pager [phone]  If 7PM-7AM, please contact night-coverage [email or web address] Password [email or web address]

## 2021-05-30 NOTE — Progress Notes (Signed)
Patient discharged in stable condition via wheelchair. Patient to drive herself home.  ?

## 2021-05-30 NOTE — Progress Notes (Signed)
SLP Cancellation Note ? ?Patient Details ?Name: ZIRA HELINSKI ?MRN: [id number] ?DOB: Jul 19, 1955 ? ? ?Cancelled treatment:       Reason Eval/Treat Not Completed: SLP screened, no needs identified, will sign off ? ?  ?Justine Dines B. Dreama Saa, M.S., CCC-SLP, CBIS ?Speech-Language Pathologist ?Rehabilitation Services ?Office [phone] ? ?Cynithia Hakimi ?05/30/2021, 1:46 PM ?

## 2021-10-05 ENCOUNTER — Other Ambulatory Visit: Payer: Self-pay | Admitting: Nurse Practitioner

## 2021-10-07 NOTE — Telephone Encounter (Signed)
Will refill for 30 days, pt needs OV.  Requested Prescriptions  Pending Prescriptions Disp Refills  . rOPINIRole (REQUIP) 2 MG tablet [Pharmacy Med Name: rOPINIRole HCl 2 MG Oral Tablet] 90 tablet 0    Sig: TAKE 1 TABLET BY MOUTH AT BEDTIME     Neurology:  Parkinsonian Agents Failed - 10/05/2021  5:05 PM      Failed - Valid encounter within last 12 months    Recent Outpatient Visits          1 year ago Mild episode of recurrent major depressive disorder (HCC)   [facility] [place], Corrie Dandy T, NP   1 year ago Other hypertrophic cardiomyopathy (HCC)   [facility] [place], [place] T, NP   3 years ago Ocular migraine   [facility] [place], [place] T, NP   3 years ago Acute maxillary sinusitis, recurrence not specified   [facility] Particia Nearing, [place]   4 years ago Essential hypertension, benign   [facility] [place], Elnita Maxwell, NP             Passed - Last BP in normal range    BP Readings from Last 1 Encounters:  05/30/21 127/83         Passed - Last Heart Rate in normal range    Pulse Readings from Last 1 Encounters:  05/30/21 89

## 2021-10-13 ENCOUNTER — Other Ambulatory Visit: Payer: Self-pay | Admitting: Nurse Practitioner

## 2021-10-13 DIAGNOSIS — R7309 Other abnormal glucose: Secondary | ICD-10-CM | POA: Insufficient documentation

## 2021-10-14 NOTE — Telephone Encounter (Signed)
Pt states that she has made an appt and that she has never been told that needed to have an appt in order to have med refilled. Her appt not until 7/21 and states that she is out of her meds and wants a refill today. Pt states she must have the losartan (COZAAR) 25 MG tablet .  PT wants a fu call to speak to nurse getting her meds before appt as she has never not been made aware of this. FU [phone]

## 2021-10-14 NOTE — Telephone Encounter (Signed)
Called and notified patient that her medications were sent in. Explained to the patient that we scheduled her an appointment because we have not seen her in over a year.

## 2021-10-15 ENCOUNTER — Other Ambulatory Visit: Payer: Self-pay

## 2021-10-15 MED ORDER — SERTRALINE HCL 100 MG PO TABS: 100.0000 mg | ORAL_TABLET | Freq: Every day | ORAL | 0 refills | Status: DC

## 2021-10-18 ENCOUNTER — Ambulatory Visit: Payer: Self-pay | Admitting: Nurse Practitioner

## 2021-10-18 DIAGNOSIS — R7309 Other abnormal glucose: Secondary | ICD-10-CM

## 2021-10-25 ENCOUNTER — Ambulatory Visit: Payer: Self-pay | Admitting: Nurse Practitioner

## 2021-11-08 ENCOUNTER — Ambulatory Visit: Payer: Self-pay | Admitting: Nurse Practitioner

## 2021-11-15 ENCOUNTER — Encounter: Payer: Self-pay | Admitting: Nurse Practitioner

## 2021-11-15 ENCOUNTER — Ambulatory Visit (INDEPENDENT_AMBULATORY_CARE_PROVIDER_SITE_OTHER): Payer: Medicare Other | Admitting: Nurse Practitioner

## 2021-11-15 VITALS — BP 126/68 | HR 98 | Temp 97.7°F | Ht 59.8 in | Wt 143.1 lb

## 2021-11-15 DIAGNOSIS — F411 Generalized anxiety disorder: Secondary | ICD-10-CM

## 2021-11-15 DIAGNOSIS — I422 Other hypertrophic cardiomyopathy: Secondary | ICD-10-CM

## 2021-11-15 DIAGNOSIS — I10 Essential (primary) hypertension: Secondary | ICD-10-CM | POA: Diagnosis not present

## 2021-11-15 DIAGNOSIS — E7800 Pure hypercholesterolemia, unspecified: Secondary | ICD-10-CM

## 2021-11-15 DIAGNOSIS — G2581 Restless legs syndrome: Secondary | ICD-10-CM

## 2021-11-15 DIAGNOSIS — Z780 Asymptomatic menopausal state: Secondary | ICD-10-CM

## 2021-11-15 DIAGNOSIS — R7309 Other abnormal glucose: Secondary | ICD-10-CM

## 2021-11-15 DIAGNOSIS — F330 Major depressive disorder, recurrent, mild: Secondary | ICD-10-CM

## 2021-11-15 DIAGNOSIS — F17210 Nicotine dependence, cigarettes, uncomplicated: Secondary | ICD-10-CM

## 2021-11-15 DIAGNOSIS — Z8673 Personal history of transient ischemic attack (TIA), and cerebral infarction without residual deficits: Secondary | ICD-10-CM

## 2021-11-15 DIAGNOSIS — Z1231 Encounter for screening mammogram for malignant neoplasm of breast: Secondary | ICD-10-CM

## 2021-11-15 MED ORDER — BUSPIRONE HCL 5 MG PO TABS: 5.0000 mg | ORAL_TABLET | Freq: Two times a day (BID) | ORAL | 12 refills | Status: DC | PRN

## 2021-11-15 MED ORDER — ROSUVASTATIN CALCIUM 10 MG PO TABS
10.0000 mg | ORAL_TABLET | Freq: Every day | ORAL | 4 refills | Status: DC
Start: 2021-11-15 — End: 2022-12-19

## 2021-11-15 MED ORDER — SERTRALINE HCL 100 MG PO TABS: 100.0000 mg | ORAL_TABLET | Freq: Every day | ORAL | 4 refills | Status: DC

## 2021-11-15 MED ORDER — ROPINIROLE HCL 2 MG PO TABS: 2.0000 mg | ORAL_TABLET | Freq: Every day | ORAL | 4 refills | Status: DC

## 2021-11-15 MED ORDER — LOSARTAN POTASSIUM 25 MG PO TABS: 25.0000 mg | ORAL_TABLET | Freq: Every day | ORAL | 4 refills | Status: DC

## 2021-11-15 MED ORDER — CLOPIDOGREL BISULFATE 75 MG PO TABS: 75.0000 mg | ORAL_TABLET | Freq: Every day | ORAL | 4 refills | Status: DC

## 2021-11-15 NOTE — Assessment & Plan Note (Signed)
Chronic, stable with Requip.  Refills sent in.  Will adjust dose as needed.  Recommend Magnesium 400 MG at night.

## 2021-11-15 NOTE — Progress Notes (Signed)
BP 126/68   Pulse 98   Temp 97.7 F (36.5 C) (Oral)   Ht 4' 11.8" (1.519 m)   Wt 143 lb 1.6 oz (64.9 kg)   SpO2 96%   BMI 28.13 kg/m    Subjective:    Patient ID: Senaida Ores, female    DOB: Oct 22, 1955, 66 y.o.   MRN: [id number]  HPI: RACHELANNE WHIDBY is a 67 y.o. female  Chief Complaint  Patient presents with   Depression   Hyperlipidemia   Hypertension   RLS   HYPERTENSION / HYPERLIPIDEMIA Taking Rosuvastatin 10 MG daily + Plavix 75 MG and Losartan 25 MG.  Has history of CVA's 2007, 2009, 2014. Also have underlying hypertrophic cardiomyopathy.  She is a current smoker, smokes about 1/2 PPD, sometimes less -- has smoked since her teen years -- has tried to quit in past. Satisfied with current treatment? yes Duration of hypertension: chronic BP monitoring frequency: not checking BP range:  BP medication side effects: no Duration of hyperlipidemia: chronic Cholesterol medication side effects: no Cholesterol supplements: none Medication compliance: fair compliance Aspirin: yes Recent stressors: no Recurrent headaches: no Visual changes: no Palpitations: no Dyspnea: no Chest pain: no Lower extremity edema: no Dizzy/lightheaded: no    RESTLESS LEG Continues on Ropinirole 2 MG QHS.  Reports this does offer benefit. Duration: chronic Discomfort description: crawling Pain: no Location: lower legs Bilateral: yes Symmetric: yes Severity: moderate Onset:  gradual Frequency:  intermittent Symptoms only occur while legs at rest: yes Sudden unintentional leg jerking: no Bed partner bothered by leg movements: no LE numbness: no Decreased sensation: no Weakness: no Insomnia: no Daytime somnolence: no Fatigue: no Alleviating factors: Requip Aggravating factors: rest Status: stable Treatments attempted: Requip  ANXIETY/STRESS Continues on Sertraline 100 MG daily.  Has occasional days of feeling overwhelmed, but overall this helps mood.  She is both  parent and grand parent -- this can be overwhelming and at times cries.  Recently took her sister's Xanax x 1 which offered benefit. Duration: stable Anxious mood: occasional Excessive worrying: yes Irritability: no  Sweating: no Nausea: no Palpitations:no Hyperventilation: no Panic attacks: no Agoraphobia: no  Obscessions/compulsions: no Depressed mood: yes    01-Dec-2021   11:08 AM 09/21/2020   10:57 AM 11/28/2019    9:43 AM 04/14/2017    4:22 PM 01/15/2016    3:56 PM  Depression screen PHQ 2/9  Decreased Interest 0 0 0 0 0  Down, Depressed, Hopeless 0 0 0 0 0  PHQ - 2 Score 0 0 0 0 0  Altered sleeping 1 0 0 0 0  Tired, decreased energy 0 0 0 0 0  Change in appetite 0 0 0 0 0  Feeling bad or failure about yourself  0 0 0 0 0  Trouble concentrating 0 0 0 0 0  Moving slowly or fidgety/restless 0 0 0 0 0  Suicidal thoughts 0 0 0 0 0  PHQ-9 Score 1 0 0 0 0  Difficult doing work/chores Not difficult at all Not difficult at all     Anhedonia: no Weight changes: no Insomnia: none Hypersomnia: no Fatigue/loss of energy: no Feelings of worthlessness: no Feelings of guilt: no Impaired concentration/indecisiveness: no Suicidal ideations: no  Crying spells: no Recent Stressors/Life Changes: no   Relationship problems: no   Family stress: no     Financial stress: no    Job stress: no    Recent death/loss: no    2021-12-01   11:08 AM 11/28/2019  9:44 AM  GAD 7 : Generalized Anxiety Score  Nervous, Anxious, on Edge 0 0  Control/stop worrying 1 0  Worry too much - different things 1 0  Trouble relaxing 0 0  Restless 0 0  Easily annoyed or irritable 0 0  Afraid - awful might happen 0 0  Total GAD 7 Score 2 0  Anxiety Difficulty Not difficult at all     Relevant past medical, surgical, family and social history reviewed and updated as indicated. Interim medical history since our last visit reviewed. Allergies and medications reviewed and updated.  Review of Systems   Constitutional:  Negative for activity change, appetite change, diaphoresis, fatigue and fever.  Respiratory:  Negative for cough, chest tightness and shortness of breath.   Cardiovascular:  Negative for chest pain, palpitations and leg swelling.  Gastrointestinal: Negative.   Endocrine: Negative for cold intolerance, heat intolerance, polydipsia, polyphagia and polyuria.  Neurological: Negative.   Psychiatric/Behavioral:  Positive for sleep disturbance. Negative for decreased concentration, self-injury and suicidal ideas. The patient is nervous/anxious.     Per HPI unless specifically indicated above     Objective:    BP 126/68   Pulse 98   Temp 97.7 F (36.5 C) (Oral)   Ht 4' 11.8" (1.519 m)   Wt 143 lb 1.6 oz (64.9 kg)   SpO2 96%   BMI 28.13 kg/m   Wt Readings from Last 3 Encounters:  11/15/21 143 lb 1.6 oz (64.9 kg)  05/29/21 140 lb (63.5 kg)  09/21/20 143 lb 3.2 oz (65 kg)    Physical Exam Vitals and nursing note reviewed.  Constitutional:      General: She is awake. She is not in acute distress.    Appearance: She is well-developed and well-groomed. She is not ill-appearing or toxic-appearing.  HENT:     Head: Normocephalic.     Right Ear: Hearing normal.     Left Ear: Hearing normal.  Eyes:     General: Lids are normal.        Right eye: No discharge.        Left eye: No discharge.     Conjunctiva/sclera: Conjunctivae normal.     Pupils: Pupils are equal, round, and reactive to light.  Neck:     Thyroid: No thyromegaly.     Vascular: No carotid bruit.  Cardiovascular:     Rate and Rhythm: Normal rate and regular rhythm.     Heart sounds: Normal heart sounds. No murmur heard.    No gallop.  Pulmonary:     Effort: Pulmonary effort is normal. No accessory muscle usage or respiratory distress.     Breath sounds: Normal breath sounds.  Abdominal:     General: Bowel sounds are normal.     Palpations: Abdomen is soft.  Musculoskeletal:     Cervical back:  Normal range of motion and neck supple.     Right lower leg: No edema.     Left lower leg: No edema.  Lymphadenopathy:     Cervical: No cervical adenopathy.  Skin:    General: Skin is warm and dry.  Neurological:     Mental Status: She is alert and oriented to person, place, and time.     Deep Tendon Reflexes: Reflexes are normal and symmetric.     Reflex Scores:      Brachioradialis reflexes are 2+ on the right side and 2+ on the left side.      Patellar reflexes are 2+ on the  right side and 2+ on the left side. Psychiatric:        Attention and Perception: Attention normal.        Mood and Affect: Mood normal.        Speech: Speech normal.        Behavior: Behavior normal. Behavior is cooperative.        Thought Content: Thought content normal.     Results for orders placed or performed during the hospital encounter of 05/29/21  Resp Panel by RT-PCR (Flu A&B, Covid) Nasopharyngeal Swab   Specimen: Nasopharyngeal Swab; Nasopharyngeal(NP) swabs in vial transport medium  Result Value Ref Range   SARS Coronavirus 2 by RT PCR NEGATIVE NEGATIVE   Influenza A by PCR NEGATIVE NEGATIVE   Influenza B by PCR NEGATIVE NEGATIVE  Protime-INR  Result Value Ref Range   Prothrombin Time 12.6 11.4 - 15.2 seconds   INR 0.9 0.8 - 1.2  APTT  Result Value Ref Range   aPTT 27 24 - 36 seconds  CBC  Result Value Ref Range   WBC 6.3 4.0 - 10.5 K/uL   RBC 5.05 3.87 - 5.11 MIL/uL   Hemoglobin 14.3 12.0 - 15.0 g/dL   HCT 23.5 57.3 - 22.0 %   MCV 89.7 80.0 - 100.0 fL   MCH 28.3 26.0 - 34.0 pg   MCHC 31.6 30.0 - 36.0 g/dL   RDW 25.4 27.0 - 62.3 %   Platelets 203 150 - 400 K/uL   nRBC 0.0 0.0 - 0.2 %  Differential  Result Value Ref Range   Neutrophils Relative % 65 %   Neutro Abs 4.1 1.7 - 7.7 K/uL   Lymphocytes Relative 25 %   Lymphs Abs 1.6 0.7 - 4.0 K/uL   Monocytes Relative 6 %   Monocytes Absolute 0.4 0.1 - 1.0 K/uL   Eosinophils Relative 2 %   Eosinophils Absolute 0.2 0.0 - 0.5 K/uL    Basophils Relative 1 %   Basophils Absolute 0.0 0.0 - 0.1 K/uL   Immature Granulocytes 1 %   Abs Immature Granulocytes 0.03 0.00 - 0.07 K/uL  Comprehensive metabolic panel  Result Value Ref Range   Sodium 140 135 - 145 mmol/L   Potassium 3.6 3.5 - 5.1 mmol/L   Chloride 104 98 - 111 mmol/L   CO2 25 22 - 32 mmol/L   Glucose, Bld 119 (H) 70 - 99 mg/dL   BUN 14 8 - 23 mg/dL   Creatinine, Ser 7.62 0.44 - 1.00 mg/dL   Calcium 9.6 8.9 - 83.1 mg/dL   Total Protein 7.8 6.5 - 8.1 g/dL   Albumin 4.1 3.5 - 5.0 g/dL   AST 18 15 - 41 U/L   ALT 11 0 - 44 U/L   Alkaline Phosphatase 84 38 - 126 U/L   Total Bilirubin 0.5 0.3 - 1.2 mg/dL   GFR, Estimated >51 >76 mL/min   Anion gap 11 5 - 15  LDL cholesterol, direct  Result Value Ref Range   Direct LDL 83.1 0 - 99 mg/dL  Hemoglobin H6W  Result Value Ref Range   Hgb A1c MFr Bld 6.0 (H) 4.8 - 5.6 %   Mean Plasma Glucose 126 mg/dL  HIV Antibody (routine testing w rflx)  Result Value Ref Range   HIV Screen 4th Generation wRfx Non Reactive Non Reactive  CBG monitoring, ED  Result Value Ref Range   Glucose-Capillary 117 (H) 70 - 99 mg/dL  ECHOCARDIOGRAM COMPLETE BUBBLE STUDY  Result Value Ref Range  Ao pk vel 1.36 m/s   AV Area VTI 1.77 cm2   AR max vel 1.75 cm2   AV Mean grad 4.0 mmHg   AV Peak grad 7.4 mmHg   S' Lateral 3.73 cm   AV Area mean vel 1.70 cm2   Area-P 1/2 5.31 cm2   MV VTI 2.36 cm2      Assessment & Plan:   Problem List Items Addressed This Visit       Cardiovascular and Mediastinum   Essential hypertension, benign    Chronic, stable with BP at goal in office today.  Recommend she monitor BP at least a few mornings a week at home and document.  DASH diet at home.  Continue current medication regimen and adjust as needed.  Labs today: CBC, CMP, TSH.  Return in 6 months.  Refills sent in.       Relevant Medications   losartan (COZAAR) 25 MG tablet   rosuvastatin (CRESTOR) 10 MG tablet   Other Relevant Orders    CBC with Differential/Platelet   Comprehensive metabolic panel   TSH   Other hypertrophic cardiomyopathy (HCC)    Ongoing, stable.  Continue ASA daily and recommend she take statin as instructed.  Would benefit return to cardiology, declines at this time.  Recommend complete cessation of smoking.      Relevant Medications   losartan (COZAAR) 25 MG tablet   rosuvastatin (CRESTOR) 10 MG tablet     Other   Depression - Primary    Chronic, exacerbated by multiple roles.  Denies SI/HI.  Continue Sertraline daily and adjust dose as needed + add on Buspar 5 MG BID PRN.  Educated on benzos and risks + that not recommended for long term use.  Refills sent in.  Recommend notify provider immediately if worsening mood.  Not to borrow relatives medications.  Return in 6 months, sooner if needed.      Relevant Medications   sertraline (ZOLOFT) 100 MG tablet   busPIRone (BUSPAR) 5 MG tablet   Elevated hemoglobin A1c measurement    Noted on past labs, recheck today and treat as needed.  For now continue healthy diet and exercise focus.      Relevant Orders   HgB A1c   Generalized anxiety disorder    Refer to depression plan of care.      Relevant Medications   sertraline (ZOLOFT) 100 MG tablet   busPIRone (BUSPAR) 5 MG tablet   History of CVA (cerebrovascular accident)    CVA's in 2007, 2009, 2014.   WNL neuro exam today.  Educated her at length of stroke prevention goals including LDL <70 and BP <130/80.  Reiterated importance of taking medication daily.      Hyperlipidemia    Chronic, in history of multiple strokes.  Continue Rosuvastatin daily.  Script sent. Lipid panel today.  Return in 6 months.      Relevant Medications   losartan (COZAAR) 25 MG tablet   rosuvastatin (CRESTOR) 10 MG tablet   Other Relevant Orders   Comprehensive metabolic panel   Lipid Panel w/o Chol/HDL Ratio   Nicotine dependence, cigarettes, uncomplicated    I have recommended complete cessation of tobacco  use. I have discussed various options available for assistance with tobacco cessation including over the counter methods (Nicotine gum, patch and lozenges). We also discussed prescription options (Chantix, Nicotine Inhaler / Nasal Spray). The patient is not interested in pursuing any prescription tobacco cessation options at this time.  Discuss lung cancer screening  next visit.       RLS (restless legs syndrome)    Chronic, stable with Requip.  Refills sent in.  Will adjust dose as needed.  Recommend Magnesium 400 MG at night.      Relevant Orders   Magnesium   Other Visit Diagnoses     Hypomagnesemia       History of low levels reported, check today as has RLS.   Relevant Orders   Magnesium   Postmenopausal estrogen deficiency       DEXA scan ordered.   Relevant Orders   DG Bone Density   Encounter for screening mammogram for malignant neoplasm of breast       Mammogram ordered.   Relevant Orders   MM 3D SCREEN BREAST BILATERAL        Follow up plan: Return in about 6 months (around 05/18/2022) for HTN/HLD, MOOD, RLS, A1c.

## 2021-11-15 NOTE — Assessment & Plan Note (Signed)
Chronic, exacerbated by multiple roles.  Denies SI/HI.  Continue Sertraline daily and adjust dose as needed + add on Buspar 5 MG BID PRN.  Educated on benzos and risks + that not recommended for long term use.  Refills sent in.  Recommend notify provider immediately if worsening mood.  Not to borrow relatives medications.  Return in 6 months, sooner if needed.

## 2021-11-15 NOTE — Assessment & Plan Note (Signed)
Refer to depression plan of care. 

## 2021-11-15 NOTE — Assessment & Plan Note (Signed)
Ongoing, stable.  Continue ASA daily and recommend she take statin as instructed.  Would benefit return to cardiology, declines at this time.  Recommend complete cessation of smoking.

## 2021-11-15 NOTE — Patient Instructions (Addendum)
We have ordered you bone density and mammogram screenings. Please contact [facility] at [phone] to get these scheduled.   DASH Eating Plan DASH stands for Dietary Approaches to Stop Hypertension. The DASH eating plan is a healthy eating plan that has been shown to: Reduce high blood pressure (hypertension). Reduce your risk for type 2 diabetes, heart disease, and stroke. Help with weight loss. What are tips for following this plan? Reading food labels Check food labels for the amount of salt (sodium) per serving. Choose foods with less than 5 percent of the Daily Value of sodium. Generally, foods with less than 300 milligrams (mg) of sodium per serving fit into this eating plan. To find whole grains, look for the word "whole" as the first word in the ingredient list. Shopping Buy products labeled as "low-sodium" or "no salt added." Buy fresh foods. Avoid canned foods and pre-made or frozen meals. Cooking Avoid adding salt when cooking. Use salt-free seasonings or herbs instead of table salt or sea salt. Check with your health care provider or pharmacist before using salt substitutes. Do not fry foods. Cook foods using healthy methods such as baking, boiling, grilling, roasting, and broiling instead. Cook with heart-healthy oils, such as olive, canola, avocado, soybean, or sunflower oil. Meal planning  Eat a balanced diet that includes: 4 or more servings of fruits and 4 or more servings of vegetables each day. Try to fill one-half of your plate with fruits and vegetables. 6-8 servings of whole grains each day. Less than 6 oz (170 g) of lean meat, poultry, or fish each day. A 3-oz (85-g) serving of meat is about the same size as a deck of cards. One egg equals 1 oz (28 g). 2-3 servings of low-fat dairy each day. One serving is 1 cup (237 mL). 1 serving of nuts, seeds, or beans 5 times each week. 2-3 servings of heart-healthy fats. Healthy fats called omega-3 fatty acids  are found in foods such as walnuts, flaxseeds, fortified milks, and eggs. These fats are also found in cold-water fish, such as sardines, salmon, and mackerel. Limit how much you eat of: Canned or prepackaged foods. Food that is high in trans fat, such as some fried foods. Food that is high in saturated fat, such as fatty meat. Desserts and other sweets, sugary drinks, and other foods with added sugar. Full-fat dairy products. Do not salt foods before eating. Do not eat more than 4 egg yolks a week. Try to eat at least 2 vegetarian meals a week. Eat more home-cooked food and less restaurant, buffet, and fast food. Lifestyle When eating at a restaurant, ask that your food be prepared with less salt or no salt, if possible. If you drink alcohol: Limit how much you use to: 0-1 drink a day for women who are not pregnant. 0-2 drinks a day for men. Be aware of how much alcohol is in your drink. In the [place] one drink equals one 12 oz bottle of beer (355 mL), one 5 oz glass of wine (148 mL), or one 1 oz glass of hard liquor (44 mL). General information Avoid eating more than 2,300 mg of salt a day. If you have hypertension, you may need to reduce your sodium intake to 1,500 mg a day. Work with your health care provider to maintain a healthy body weight or to lose weight. Ask what an ideal weight is for you. Get at least 30 minutes of exercise that causes your heart to beat faster (  aerobic exercise) most days of the week. Activities may include walking, swimming, or biking. Work with your health care provider or dietitian to adjust your eating plan to your individual calorie needs. What foods should I eat? Fruits All fresh, dried, or frozen fruit. Canned fruit in natural juice (without added sugar). Vegetables Fresh or frozen vegetables (raw, steamed, roasted, or grilled). Low-sodium or reduced-sodium tomato and vegetable juice. Low-sodium or reduced-sodium tomato sauce and tomato paste.  Low-sodium or reduced-sodium canned vegetables. Grains Whole-grain or whole-wheat bread. Whole-grain or whole-wheat pasta. Brown rice. Orpah Cobb. Bulgur. Whole-grain and low-sodium cereals. Pita bread. Low-fat, low-sodium crackers. Whole-wheat flour tortillas. Meats and other proteins Skinless chicken or [place]. Ground chicken or [place]. Pork with fat trimmed off. Fish and seafood. Egg whites. Dried beans, peas, or lentils. Unsalted nuts, nut butters, and seeds. Unsalted canned beans. Lean cuts of beef with fat trimmed off. Low-sodium, lean precooked or cured meat, such as sausages or meat loaves. Dairy Low-fat (1%) or fat-free (skim) milk. Reduced-fat, low-fat, or fat-free cheeses. Nonfat, low-sodium ricotta or cottage cheese. Low-fat or nonfat yogurt. Low-fat, low-sodium cheese. Fats and oils Soft margarine without trans fats. Vegetable oil. Reduced-fat, low-fat, or light mayonnaise and salad dressings (reduced-sodium). Canola, safflower, olive, avocado, soybean, and sunflower oils. Avocado. Seasonings and condiments Herbs. Spices. Seasoning mixes without salt. Other foods Unsalted popcorn and pretzels. Fat-free sweets. The items listed above may not be a complete list of foods and beverages you can eat. Contact a dietitian for more information. What foods should I avoid? Fruits Canned fruit in a light or heavy syrup. Fried fruit. Fruit in cream or butter sauce. Vegetables Creamed or fried vegetables. Vegetables in a cheese sauce. Regular canned vegetables (not low-sodium or reduced-sodium). Regular canned tomato sauce and paste (not low-sodium or reduced-sodium). Regular tomato and vegetable juice (not low-sodium or reduced-sodium). Rosita Fire. Olives. Grains Baked goods made with fat, such as croissants, muffins, or some breads. Dry pasta or rice meal packs. Meats and other proteins Fatty cuts of meat. Ribs. Fried meat. Tomasa Blase. Bologna, salami, and other precooked or cured meats, such as  sausages or meat loaves. Fat from the back of a pig (fatback). Bratwurst. Salted nuts and seeds. Canned beans with added salt. Canned or smoked fish. Whole eggs or egg yolks. Chicken or [place] with skin. Dairy Whole or 2% milk, cream, and half-and-half. Whole or full-fat cream cheese. Whole-fat or sweetened yogurt. Full-fat cheese. Nondairy creamers. Whipped toppings. Processed cheese and cheese spreads. Fats and oils Butter. Stick margarine. Lard. Shortening. Ghee. Bacon fat. Tropical oils, such as coconut, palm kernel, or palm oil. Seasonings and condiments Onion salt, garlic salt, seasoned salt, table salt, and sea salt. Worcestershire sauce. Tartar sauce. Barbecue sauce. Teriyaki sauce. Soy sauce, including reduced-sodium. Steak sauce. Canned and packaged gravies. Fish sauce. Oyster sauce. Cocktail sauce. Store-bought horseradish. Ketchup. Mustard. Meat flavorings and tenderizers. Bouillon cubes. Hot sauces. Pre-made or packaged marinades. Pre-made or packaged taco seasonings. Relishes. Regular salad dressings. Other foods Salted popcorn and pretzels. The items listed above may not be a complete list of foods and beverages you should avoid. Contact a dietitian for more information. Where to find more information National Heart, Lung, and Blood Institute: PopSteam.is American Heart Association: www.heart.org Academy of Nutrition and Dietetics: www.eatright.org National Kidney Foundation: www.kidney.org Summary The DASH eating plan is a healthy eating plan that has been shown to reduce high blood pressure (hypertension). It may also reduce your risk for type 2 diabetes, heart disease, and stroke. When on the  DASH eating plan, aim to eat more fresh fruits and vegetables, whole grains, lean proteins, low-fat dairy, and heart-healthy fats. With the DASH eating plan, you should limit salt (sodium) intake to 2,300 mg a day. If you have hypertension, you may need to reduce your sodium intake to  1,500 mg a day. Work with your health care provider or dietitian to adjust your eating plan to your individual calorie needs. This information is not intended to replace advice given to you by your health care provider. Make sure you discuss any questions you have with your health care provider. Document Revised: 02/18/2019 Document Reviewed: 02/18/2019 Elsevier Patient Education  2023 [employer].

## 2021-11-15 NOTE — Assessment & Plan Note (Signed)
Chronic, in history of multiple strokes.  Continue Rosuvastatin daily.  Script sent. Lipid panel today.  Return in 6 months.

## 2021-11-15 NOTE — Assessment & Plan Note (Signed)
CVA's in 2007, 2009, 2014.   WNL neuro exam today.  Educated her at length of stroke prevention goals including LDL <70 and BP <130/80.  Reiterated importance of taking medication daily.

## 2021-11-15 NOTE — Assessment & Plan Note (Signed)
Chronic, stable with BP at goal in office today.  Recommend she monitor BP at least a few mornings a week at home and document.  DASH diet at home.  Continue current medication regimen and adjust as needed.  Labs today: CBC, CMP, TSH.  Return in 6 months.  Refills sent in.

## 2021-11-15 NOTE — Assessment & Plan Note (Signed)
I have recommended complete cessation of tobacco use. I have discussed various options available for assistance with tobacco cessation including over the counter methods (Nicotine gum, patch and lozenges). We also discussed prescription options (Chantix, Nicotine Inhaler / Nasal Spray). The patient is not interested in pursuing any prescription tobacco cessation options at this time.  Discuss lung cancer screening next visit.

## 2021-11-15 NOTE — Assessment & Plan Note (Signed)
Noted on past labs, recheck today and treat as needed.  For now continue healthy diet and exercise focus.

## 2021-11-16 LAB — CBC WITH DIFFERENTIAL/PLATELET
Basophils Absolute: 0.1 10*3/uL (ref 0.0–0.2)
Basos: 1 %
EOS (ABSOLUTE): 0.2 10*3/uL (ref 0.0–0.4)
Eos: 2 %
Hematocrit: 41.8 % (ref 34.0–46.6)
Hemoglobin: 13.9 g/dL (ref 11.1–15.9)
Immature Grans (Abs): 0 10*3/uL (ref 0.0–0.1)
Immature Granulocytes: 0 %
Lymphocytes Absolute: 2.1 10*3/uL (ref 0.7–3.1)
Lymphs: 25 %
MCH: 29.1 pg (ref 26.6–33.0)
MCHC: 33.3 g/dL (ref 31.5–35.7)
MCV: 88 fL (ref 79–97)
Monocytes Absolute: 0.6 10*3/uL (ref 0.1–0.9)
Monocytes: 7 %
Neutrophils Absolute: 5.3 10*3/uL (ref 1.4–7.0)
Neutrophils: 65 %
RBC: 4.77 x10E6/uL (ref 3.77–5.28)
RDW: 13.6 % (ref 11.7–15.4)
WBC: 8.2 10*3/uL (ref 3.4–10.8)

## 2021-11-16 LAB — COMPREHENSIVE METABOLIC PANEL
ALT: 6 IU/L (ref 0–32)
AST: 14 IU/L (ref 0–40)
Albumin/Globulin Ratio: 1.4 (ref 1.2–2.2)
Albumin: 4.1 g/dL (ref 3.9–4.9)
Alkaline Phosphatase: 114 IU/L (ref 44–121)
BUN/Creatinine Ratio: 9 — ABNORMAL LOW (ref 12–28)
BUN: 9 mg/dL (ref 8–27)
Bilirubin Total: 0.4 mg/dL (ref 0.0–1.2)
CO2: 24 mmol/L (ref 20–29)
Calcium: 9.7 mg/dL (ref 8.7–10.3)
Chloride: 104 mmol/L (ref 96–106)
Creatinine, Ser: 1 mg/dL (ref 0.57–1.00)
Globulin, Total: 3 g/dL (ref 1.5–4.5)
Glucose: 92 mg/dL (ref 70–99)
Potassium: 4.3 mmol/L (ref 3.5–5.2)
Sodium: 142 mmol/L (ref 134–144)
Total Protein: 7.1 g/dL (ref 6.0–8.5)
eGFR: 63 mL/min/{1.73_m2} (ref >59)

## 2021-11-16 LAB — HEMOGLOBIN A1C
Est. average glucose Bld gHb Est-mCnc: 126 mg/dL
Hgb A1c MFr Bld: 6 % — ABNORMAL HIGH (ref 4.8–5.6)

## 2021-11-16 LAB — LIPID PANEL W/O CHOL/HDL RATIO
Cholesterol, Total: 189 mg/dL (ref 100–199)
HDL: 52 mg/dL (ref >39)
LDL Chol Calc (NIH): 113 mg/dL — ABNORMAL HIGH (ref 0–99)
Triglycerides: 135 mg/dL (ref 0–149)
VLDL Cholesterol Cal: 24 mg/dL (ref 5–40)

## 2021-11-16 LAB — MAGNESIUM: Magnesium: 2.2 mg/dL (ref 1.6–2.3)

## 2021-11-16 LAB — TSH: TSH: 0.984 u[IU]/mL (ref 0.450–4.500)

## 2021-11-16 NOTE — Progress Notes (Signed)
Contacted via [place] [facility] [place], your labs have returned: - Kidney function, creatinine and eGFR, remains normal, as is liver function, AST and ALT.   - Cholesterol labs show LDL above goal -- please ensure you restart Rosuvastatin and take daily.  We will recheck next visit and may increase dosage if LDL remains above goal. I want to see this less then 70. - CBC, thyroid, and magnesium are all normal. - The A1C is the diabetes testing we talked about, this looks at your blood sugars over the past 3 months and turns the average into a number.  Your number is 6.0%, meaning you are prediabetic.  Any number 5.7 to 6.4 is considered prediabetes and any number 6.5 or greater is considered diabetes.   I would recommend heavy focus on decreasing foods high in sugar and your intake of things like bread products, pasta, and rice.  The American Diabetes Association online has a large amount of information on diet changes to make.  We will recheck this number in 3-6 months to ensure you are not continuing to trend upwards and move into diabetes.  Have a good day.  Any questions? Keep being amazing!!  Thank you for allowing me to participate in your care.  I appreciate you. Kindest regards, Dotsie Gillette

## 2021-12-26 ENCOUNTER — Emergency Department
Admission: EM | Admit: 2021-12-26 | Discharge: 2021-12-26 | Disposition: A | Discharge status: Home / Self Care | Payer: Medicare Other | Attending: Emergency Medicine | Admitting: Emergency Medicine

## 2021-12-26 ENCOUNTER — Other Ambulatory Visit: Payer: Self-pay

## 2021-12-26 ENCOUNTER — Emergency Department: Payer: Medicare Other

## 2021-12-26 DIAGNOSIS — Z5321 Procedure and treatment not carried out due to patient leaving prior to being seen by health care provider: Secondary | ICD-10-CM | POA: Diagnosis not present

## 2021-12-26 DIAGNOSIS — I6782 Cerebral ischemia: Secondary | ICD-10-CM | POA: Diagnosis not present

## 2021-12-26 DIAGNOSIS — Z8673 Personal history of transient ischemic attack (TIA), and cerebral infarction without residual deficits: Secondary | ICD-10-CM | POA: Insufficient documentation

## 2021-12-26 DIAGNOSIS — R9431 Abnormal electrocardiogram [ECG] [EKG]: Secondary | ICD-10-CM | POA: Insufficient documentation

## 2021-12-26 DIAGNOSIS — Z7982 Long term (current) use of aspirin: Secondary | ICD-10-CM | POA: Diagnosis not present

## 2021-12-26 DIAGNOSIS — Z7901 Long term (current) use of anticoagulants: Secondary | ICD-10-CM | POA: Diagnosis not present

## 2021-12-26 DIAGNOSIS — H53459 Other localized visual field defect, unspecified eye: Secondary | ICD-10-CM | POA: Insufficient documentation

## 2021-12-26 LAB — COMPREHENSIVE METABOLIC PANEL
ALT: 10 U/L (ref 0–44)
AST: 19 U/L (ref 15–41)
Albumin: 4.3 g/dL (ref 3.5–5.0)
Alkaline Phosphatase: 87 U/L (ref 38–126)
Anion gap: 9 (ref 5–15)
BUN: 10 mg/dL (ref 8–23)
CO2: 26 mmol/L (ref 22–32)
Calcium: 9.4 mg/dL (ref 8.9–10.3)
Chloride: 105 mmol/L (ref 98–111)
Creatinine, Ser: 0.9 mg/dL (ref 0.44–1.00)
GFR, Estimated: >60 mL/min (ref >60)
Glucose, Bld: 127 mg/dL — ABNORMAL HIGH (ref 70–99)
Potassium: 3.3 mmol/L — ABNORMAL LOW (ref 3.5–5.1)
Sodium: 140 mmol/L (ref 135–145)
Total Bilirubin: 0.7 mg/dL (ref 0.3–1.2)
Total Protein: 7.8 g/dL (ref 6.5–8.1)

## 2021-12-26 LAB — DIFFERENTIAL
Abs Immature Granulocytes: 0.02 10*3/uL (ref 0.00–0.07)
Basophils Absolute: 0.1 10*3/uL (ref 0.0–0.1)
Basophils Relative: 1 %
Eosinophils Absolute: 0.1 10*3/uL (ref 0.0–0.5)
Eosinophils Relative: 1 %
Immature Granulocytes: 0 %
Lymphocytes Relative: 27 %
Lymphs Abs: 1.8 10*3/uL (ref 0.7–4.0)
Monocytes Absolute: 0.3 10*3/uL (ref 0.1–1.0)
Monocytes Relative: 4 %
Neutro Abs: 4.4 10*3/uL (ref 1.7–7.7)
Neutrophils Relative %: 67 %

## 2021-12-26 LAB — CBC
HCT: 45.6 % (ref 36.0–46.0)
Hemoglobin: 14.2 g/dL (ref 12.0–15.0)
MCH: 28.1 pg (ref 26.0–34.0)
MCHC: 31.1 g/dL (ref 30.0–36.0)
MCV: 90.1 fL (ref 80.0–100.0)
Platelets: 122 10*3/uL — ABNORMAL LOW (ref 150–400)
RBC: 5.06 MIL/uL (ref 3.87–5.11)
RDW: 13.7 % (ref 11.5–15.5)
WBC: 6.6 10*3/uL (ref 4.0–10.5)
nRBC: 0 % (ref 0.0–0.2)

## 2021-12-26 LAB — PROTIME-INR
INR: 1 (ref 0.8–1.2)
Prothrombin Time: 12.6 seconds (ref 11.4–15.2)

## 2021-12-26 LAB — ETHANOL: Alcohol, Ethyl (B): <10 mg/dL (ref <10)

## 2021-12-26 LAB — APTT: aPTT: 28 seconds (ref 24–36)

## 2021-12-26 LAB — CBG MONITORING, ED: Glucose-Capillary: 123 mg/dL — ABNORMAL HIGH (ref 70–99)

## 2021-12-26 MED ORDER — SODIUM CHLORIDE 0.9% FLUSH: 3.0000 mL | Freq: Once | INTRAVENOUS | Status: DC

## 2021-12-26 NOTE — ED Provider Triage Note (Signed)
Emergency Medicine Provider Triage Evaluation Note  Vicki Rosales , a 66 y.o. female  was evaluated in triage.  Pt complains of peripheral vision changes.  Patient's last known well was last night.  No headache.  History of stroke.  Patient is currently taking Plavix and ASA.  Review of Systems  Positive: Peripheral vision change Negative: Fever  Physical Exam  BP (!) 172/93 (BP Location: Left Arm)   Pulse 89   Resp 18   Ht 5' (1.524 m)   Wt 63.5 kg   SpO2 96%   BMI 27.34 kg/m  Gen:   Awake, no distress   Resp:  Normal effort  MSK:   Moves extremities without difficulty  Other:    Medical Decision Making  Medically screening exam initiated at 11:20 AM.  Appropriate orders placed.  [name] was informed that the remainder of the evaluation will be completed by another provider, this initial triage assessment does not replace that evaluation, and the importance of remaining in the ED until their evaluation is complete.  Patient appears to be alert and oriented, grips equal bilaterally, due to the peripheral vision change along with history of stroke we will do a CT of the head.  Did not call code stroke as last known well was greater than 4 hours   [name], PA-C 12/26/21 1127

## 2021-12-26 NOTE — ED Notes (Signed)
No facial drooping, limb weakness or motor changes noted, pt speaking in clear sentences.

## 2021-12-26 NOTE — ED Notes (Signed)
Pt transported to CT ?

## 2021-12-26 NOTE — ED Notes (Signed)
Patient informed RN of deciding to leave prior to being room. Patient encouraged to stay and be seen. Patient ambulatory and AOX4. Left with family.

## 2021-12-26 NOTE — ED Triage Notes (Signed)
Pt states she has a hx of visual field changes with her peripheral vision and has similar thing happen this morning, pt states vision was blurry in her right peripheral when she woke up and her vision has gotten more blurry as the day has gone on. Pt has hx of stroke in 2009. Pt states she takes plavix. Pt can see movement in her right peripheral vision but it is blurry. LKW yesterday 10pm.

## 2021-12-30 ENCOUNTER — Telehealth: Payer: Self-pay

## 2021-12-30 NOTE — Telephone Encounter (Signed)
Attempted to initiate Transition of Care Call for patient. According to patient's chart. Patient left the ED without being seen.

## 2022-01-01 ENCOUNTER — Other Ambulatory Visit: Payer: Self-pay

## 2022-05-18 NOTE — Patient Instructions (Incomplete)
Please call to schedule your mammogram and/or bone density: [facility]: [address], [place], [place] [address] Phone: [phone]  [place] [facility] [address] [place],  [place]  [address] Phone: [phone]    DASH Eating Plan DASH stands for Dietary Approaches to Stop Hypertension. The DASH eating plan is a healthy eating plan that has been shown to: Reduce high blood pressure (hypertension). Reduce your risk for type 2 diabetes, heart disease, and stroke. Help with weight loss. What are tips for following this plan? Reading food labels Check food labels for the amount of salt (sodium) per serving. Choose foods with less than 5 percent of the Daily Value of sodium. Generally, foods with less than 300 milligrams (mg) of sodium per serving fit into this eating plan. To find whole grains, look for the word "whole" as the first word in the ingredient list. Shopping Buy products labeled as "low-sodium" or "no salt added." Buy fresh foods. Avoid canned foods and pre-made or frozen meals. Cooking Avoid adding salt when cooking. Use salt-free seasonings or herbs instead of table salt or sea salt. Check with your health care provider or pharmacist before using salt substitutes. Do not fry foods. Cook foods using healthy methods such as baking, boiling, grilling, roasting, and broiling instead. Cook with heart-healthy oils, such as olive, canola, avocado, soybean, or sunflower oil. Meal planning  Eat a balanced diet that includes: 4 or more servings of fruits and 4 or more servings of vegetables each day. Try to fill one-half of your plate with fruits and vegetables. 6-8 servings of whole grains each day. Less than 6 oz (170 g) of lean meat, poultry, or fish each day. A 3-oz (85-g) serving of meat is about the same size as a deck of cards. One egg equals 1 oz (28 g). 2-3 servings of low-fat dairy each  day. One serving is 1 cup (237 mL). 1 serving of nuts, seeds, or beans 5 times each week. 2-3 servings of heart-healthy fats. Healthy fats called omega-3 fatty acids are found in foods such as walnuts, flaxseeds, fortified milks, and eggs. These fats are also found in cold-water fish, such as sardines, salmon, and mackerel. Limit how much you eat of: Canned or prepackaged foods. Food that is high in trans fat, such as some fried foods. Food that is high in saturated fat, such as fatty meat. Desserts and other sweets, sugary drinks, and other foods with added sugar. Full-fat dairy products. Do not salt foods before eating. Do not eat more than 4 egg yolks a week. Try to eat at least 2 vegetarian meals a week. Eat more home-cooked food and less restaurant, buffet, and fast food. Lifestyle When eating at a restaurant, ask that your food be prepared with less salt or no salt, if possible. If you drink alcohol: Limit how much you use to: 0-1 drink a day for women who are not pregnant. 0-2 drinks a day for men. Be aware of how much alcohol is in your drink. In the [place] one drink equals one 12 oz bottle of beer (355 mL), one 5 oz glass of wine (148 mL), or one 1 oz glass of hard liquor (44 mL). General information Avoid eating more than 2,300 mg of salt a day. If you have hypertension, you may need to reduce your sodium intake to 1,500 mg a day. Work with your health care provider to maintain a healthy  body weight or to lose weight. Ask what an ideal weight is for you. Get at least 30 minutes of exercise that causes your heart to beat faster (aerobic exercise) most days of the week. Activities may include walking, swimming, or biking. Work with your health care provider or dietitian to adjust your eating plan to your individual calorie needs. What foods should I eat? Fruits All fresh, dried, or frozen fruit. Canned fruit in natural juice (without added sugar). Vegetables Fresh or frozen  vegetables (raw, steamed, roasted, or grilled). Low-sodium or reduced-sodium tomato and vegetable juice. Low-sodium or reduced-sodium tomato sauce and tomato paste. Low-sodium or reduced-sodium canned vegetables. Grains Whole-grain or whole-wheat bread. Whole-grain or whole-wheat pasta. Brown rice. Modena Morrow. Bulgur. Whole-grain and low-sodium cereals. Pita bread. Low-fat, low-sodium crackers. Whole-wheat flour tortillas. Meats and other proteins Skinless chicken or [place]. Ground chicken or [place]. Pork with fat trimmed off. Fish and seafood. Egg whites. Dried beans, peas, or lentils. Unsalted nuts, nut butters, and seeds. Unsalted canned beans. Lean cuts of beef with fat trimmed off. Low-sodium, lean precooked or cured meat, such as sausages or meat loaves. Dairy Low-fat (1%) or fat-free (skim) milk. Reduced-fat, low-fat, or fat-free cheeses. Nonfat, low-sodium ricotta or cottage cheese. Low-fat or nonfat yogurt. Low-fat, low-sodium cheese. Fats and oils Soft margarine without trans fats. Vegetable oil. Reduced-fat, low-fat, or light mayonnaise and salad dressings (reduced-sodium). Canola, safflower, olive, avocado, soybean, and sunflower oils. Avocado. Seasonings and condiments Herbs. Spices. Seasoning mixes without salt. Other foods Unsalted popcorn and pretzels. Fat-free sweets. The items listed above may not be a complete list of foods and beverages you can eat. Contact a dietitian for more information. What foods should I avoid? Fruits Canned fruit in a light or heavy syrup. Fried fruit. Fruit in cream or butter sauce. Vegetables Creamed or fried vegetables. Vegetables in a cheese sauce. Regular canned vegetables (not low-sodium or reduced-sodium). Regular canned tomato sauce and paste (not low-sodium or reduced-sodium). Regular tomato and vegetable juice (not low-sodium or reduced-sodium). Angie Fava. Olives. Grains Baked goods made with fat, such as croissants, muffins, or some  breads. Dry pasta or rice meal packs. Meats and other proteins Fatty cuts of meat. Ribs. Fried meat. Berniece Salines. Bologna, salami, and other precooked or cured meats, such as sausages or meat loaves. Fat from the back of a pig (fatback). Bratwurst. Salted nuts and seeds. Canned beans with added salt. Canned or smoked fish. Whole eggs or egg yolks. Chicken or [place] with skin. Dairy Whole or 2% milk, cream, and half-and-half. Whole or full-fat cream cheese. Whole-fat or sweetened yogurt. Full-fat cheese. Nondairy creamers. Whipped toppings. Processed cheese and cheese spreads. Fats and oils Butter. Stick margarine. Lard. Shortening. Ghee. Bacon fat. Tropical oils, such as coconut, palm kernel, or palm oil. Seasonings and condiments Onion salt, garlic salt, seasoned salt, table salt, and sea salt. Worcestershire sauce. Tartar sauce. Barbecue sauce. Teriyaki sauce. Soy sauce, including reduced-sodium. Steak sauce. Canned and packaged gravies. Fish sauce. Oyster sauce. Cocktail sauce. Store-bought horseradish. Ketchup. Mustard. Meat flavorings and tenderizers. Bouillon cubes. Hot sauces. Pre-made or packaged marinades. Pre-made or packaged taco seasonings. Relishes. Regular salad dressings. Other foods Salted popcorn and pretzels. The items listed above may not be a complete list of foods and beverages you should avoid. Contact a dietitian for more information. Where to find more information National Heart, Lung, and Blood Institute: https://wilson-eaton.com/ American Heart Association: www.heart.org Academy of Nutrition and Dietetics: www.eatright.[place]: www.kidney.org Summary The DASH eating plan is a healthy eating  plan that has been shown to reduce high blood pressure (hypertension). It may also reduce your risk for type 2 diabetes, heart disease, and stroke. When on the DASH eating plan, aim to eat more fresh fruits and vegetables, whole grains, lean proteins, low-fat dairy, and  heart-healthy fats. With the DASH eating plan, you should limit salt (sodium) intake to 2,300 mg a day. If you have hypertension, you may need to reduce your sodium intake to 1,500 mg a day. Work with your health care provider or dietitian to adjust your eating plan to your individual calorie needs. This information is not intended to replace advice given to you by your health care provider. Make sure you discuss any questions you have with your health care provider. Document Revised: 02/18/2019 Document Reviewed: 02/18/2019 Elsevier Patient Education  [place].

## 2022-05-19 ENCOUNTER — Ambulatory Visit: Payer: Medicare Other | Admitting: Nurse Practitioner

## 2022-05-19 DIAGNOSIS — R7309 Other abnormal glucose: Secondary | ICD-10-CM

## 2022-05-19 DIAGNOSIS — Z87898 Personal history of other specified conditions: Secondary | ICD-10-CM

## 2022-05-19 DIAGNOSIS — Z23 Encounter for immunization: Secondary | ICD-10-CM

## 2022-05-19 DIAGNOSIS — Z124 Encounter for screening for malignant neoplasm of cervix: Secondary | ICD-10-CM

## 2022-05-19 DIAGNOSIS — I422 Other hypertrophic cardiomyopathy: Secondary | ICD-10-CM

## 2022-05-19 DIAGNOSIS — F330 Major depressive disorder, recurrent, mild: Secondary | ICD-10-CM

## 2022-05-19 DIAGNOSIS — E7800 Pure hypercholesterolemia, unspecified: Secondary | ICD-10-CM

## 2022-05-19 DIAGNOSIS — Z0000 Encounter for general adult medical examination without abnormal findings: Secondary | ICD-10-CM

## 2022-05-19 DIAGNOSIS — G2581 Restless legs syndrome: Secondary | ICD-10-CM

## 2022-05-19 DIAGNOSIS — F17210 Nicotine dependence, cigarettes, uncomplicated: Secondary | ICD-10-CM

## 2022-05-19 DIAGNOSIS — Z8673 Personal history of transient ischemic attack (TIA), and cerebral infarction without residual deficits: Secondary | ICD-10-CM

## 2022-05-19 DIAGNOSIS — F411 Generalized anxiety disorder: Secondary | ICD-10-CM

## 2022-05-19 DIAGNOSIS — Z780 Asymptomatic menopausal state: Secondary | ICD-10-CM

## 2022-05-19 DIAGNOSIS — I10 Essential (primary) hypertension: Secondary | ICD-10-CM

## 2022-07-18 ENCOUNTER — Ambulatory Visit: Payer: Medicare Other | Admitting: Nurse Practitioner

## 2022-07-23 ENCOUNTER — Encounter: Payer: Self-pay | Admitting: Nurse Practitioner

## 2022-09-29 ENCOUNTER — Ambulatory Visit (INDEPENDENT_AMBULATORY_CARE_PROVIDER_SITE_OTHER): Payer: Medicare Other

## 2022-09-29 VITALS — Ht 59.75 in | Wt 143.0 lb

## 2022-09-29 DIAGNOSIS — Z0000 Encounter for general adult medical examination without abnormal findings: Secondary | ICD-10-CM

## 2022-09-29 NOTE — Progress Notes (Signed)
Subjective:   Vicki Rosales is a 67 y.o. female who presents for an Initial Medicare Annual Wellness Visit.  Visit Complete: Virtual  I connected with  Vicki Rosales on 09/29/22 by a audio enabled telemedicine application and verified that I am speaking with the correct person using two identifiers.  Patient Location: Home  Provider Location: Office/Clinic  I discussed the limitations of evaluation and management by telemedicine. The patient expressed understanding and agreed to proceed.  Review of Systems     Cardiac Risk Factors include: advanced age (>50men, >8 women);hypertension;dyslipidemia;sedentary lifestyle     Objective:    Today's Vitals   09/29/22 1543  Weight: 143 lb (64.9 kg)  Height: 4' 11.75" (1.518 m)   Body mass index is 28.16 kg/m.     09/29/2022    3:32 PM 12/26/2021   11:11 AM 05/29/2021    7:57 AM 05/05/2017    9:10 AM 01/01/2017    7:53 PM  Advanced Directives  Does Patient Have a Medical Advance Directive? No No No No No  Would patient like information on creating a medical advance directive? No - Patient declined No - Patient declined No - Patient declined No - Patient declined     Current Medications (verified) Outpatient Encounter Medications as of 09/29/2022  Medication Sig   aspirin 81 MG chewable tablet Chew 1 tablet (81 mg total) by mouth daily.   busPIRone (BUSPAR) 5 MG tablet Take 1 tablet (5 mg total) by mouth 2 (two) times daily as needed.   clopidogrel (PLAVIX) 75 MG tablet Take 1 tablet (75 mg total) by mouth daily.   losartan (COZAAR) 25 MG tablet Take 1 tablet (25 mg total) by mouth daily.   rOPINIRole (REQUIP) 2 MG tablet Take 1 tablet (2 mg total) by mouth at bedtime.   rosuvastatin (CRESTOR) 10 MG tablet Take 1 tablet (10 mg total) by mouth daily.   sertraline (ZOLOFT) 100 MG tablet Take 1 tablet (100 mg total) by mouth daily.   No facility-administered encounter medications on file as of 09/29/2022.    Allergies  (verified) Pravastatin   History: Past Medical History:  Diagnosis Date   Anxiety    Cardiomyopathy (HCC)    EF of 35 %   CVA (cerebral infarction) 2007 and 2008   followed by Dr, Sherryll Burger   Depression    GERD (gastroesophageal reflux disease)    History of seizure    after her stroke   Hyperlipidemia    Migraines    ocular   RLS (restless legs syndrome)    Seizures (HCC)    Stroke (HCC)    TIA (transient ischemic attack) 2014   followed by Dr. Sherryll Burger   Tobacco abuse    Past Surgical History:  Procedure Laterality Date   CESAREAN SECTION     Family History  Problem Relation Age of Onset   Hypertension Mother    Heart attack Father    Heart disease Father        CABG   Heart disease Sister    Hyperlipidemia Sister    Hypertension Sister    Asthma Sister    Diabetes Sister    COPD Sister    Hypertension Brother    Hyperlipidemia Brother    Heart disease Maternal Grandmother        CHF   Diabetes Maternal Grandfather    Diabetes Other    Social History   Socioeconomic History   Marital status: Divorced    Spouse name:  Not on file   Number of children: Not on file   Years of education: Not on file   Highest education level: Not on file  Occupational History   Not on file  Tobacco Use   Smoking status: Every Day    Packs/day: 0.50    Years: 30.00    Additional pack years: 0.00    Total pack years: 15.00    Types: Cigarettes   Smokeless tobacco: Never  Vaping Use   Vaping Use: Never used  Substance and Sexual Activity   Alcohol use: No   Drug use: No   Sexual activity: Never  Other Topics Concern   Not on file  Social History Narrative   Not on file   Social Determinants of Health   Financial Resource Strain: Low Risk  (09/29/2022)   Overall Financial Resource Strain (CARDIA)    Difficulty of Paying Living Expenses: Not very hard  Food Insecurity: No Food Insecurity (09/29/2022)   Hunger Vital Sign    Worried About Running Out of Food in the Last  Year: Never true    Ran Out of Food in the Last Year: Never true  Transportation Needs: No Transportation Needs (09/29/2022)   PRAPARE - Administrator, Civil Service (Medical): No    Lack of Transportation (Non-Medical): No  Physical Activity: Inactive (09/29/2022)   Exercise Vital Sign    Days of Exercise per Week: 0 days    Minutes of Exercise per Session: 0 min  Stress: No Stress Concern Present (09/29/2022)   [employer] of Occupational Health - Occupational Stress Questionnaire    Feeling of Stress : Not at all  Social Connections: Moderately Isolated (09/29/2022)   Social Connection and Isolation Panel [NHANES]    Frequency of Communication with Friends and Family: More than three times a week    Frequency of Social Gatherings with Friends and Family: Once a week    Attends Religious Services: More than 4 times per year    Active Member of [employer] or Organizations: No    Attends Engineer, structural: Never    Marital Status: Divorced    Tobacco Counseling Ready to quit: Not Answered Counseling given: Not Answered   Clinical Intake:  Pre-visit preparation completed: Yes  Pain : No/denies pain     Nutritional Risks: None Diabetes: No  How often do you need to have someone help you when you read instructions, pamphlets, or other written materials from your doctor or pharmacy?: 1 - Never  Interpreter Needed?: No  Information entered by :: Kennedy Bucker, LPN   Activities of Daily Living    09/29/2022    3:33 PM  In your present state of health, do you have any difficulty performing the following activities:  Hearing? 0  Vision? 0  Difficulty concentrating or making decisions? 0  Walking or climbing stairs? 0  Dressing or bathing? 0  Doing errands, shopping? 0  Preparing Food and eating ? N  Using the Toilet? N  In the past six months, have you accidently leaked urine? N  Do you have problems with loss of bowel control? N  Managing your  Medications? N  Managing your Finances? N  Housekeeping or managing your Housekeeping? N    Patient Care Team: Marjie Skiff, NP as PCP - General (Nurse Practitioner) Lonell Face, MD as Consulting Physician (Neurology) Antonieta Iba, MD as Consulting Physician (Cardiology)  Indicate any recent Medical Services you may have received from other  than [facility] providers in the past year (date may be approximate).     Assessment:   This is a routine wellness examination for Vicki Rosales.  Hearing/Vision screen Hearing Screening - Comments:: No aids Vision Screening - Comments:: Readers-  Dietary issues and exercise activities discussed:     Goals Addressed             This Visit's Progress    DIET - EAT MORE FRUITS AND VEGETABLES         Depression Screen    09/29/2022    3:31 PM 11/15/2021   11:08 AM 09/21/2020   10:57 AM 11/28/2019    9:43 AM 04/14/2017    4:22 PM 01/15/2016    3:56 PM 10/27/2014    5:04 PM  PHQ 2/9 Scores  PHQ - 2 Score 0 0 0 0 0 0 0  PHQ- 9 Score 0 1 0 0 0 0     Fall Risk    09/29/2022    3:33 PM 11/15/2021   11:08 AM 11/28/2019    9:42 AM 10/27/2014    5:04 PM  Fall Risk   Falls in the past year? 0 0 0 No  Number falls in past yr: 0 0 0   Injury with Fall? 0 0 0   Risk for fall due to : No Fall Risks No Fall Risks    Follow up Falls prevention discussed;Falls evaluation completed Falls evaluation completed      MEDICARE RISK AT HOME:  Medicare Risk at Home - 09/29/22 1534     Any stairs in or around the home? Yes    If so, are there any without handrails? No    Home free of loose throw rugs in walkways, pet beds, electrical cords, etc? Yes    Adequate lighting in your home to reduce risk of falls? Yes    Life alert? No    Use of a cane, walker or w/c? No    Grab bars in the bathroom? Yes    Shower chair or bench in shower? No    Elevated toilet seat or a handicapped toilet? No             TIMED UP AND GO:  Was the test  performed? No    Cognitive Function:        09/29/2022    3:35 PM  6CIT Screen  What Year? 0 points  What month? 0 points  What time? 0 points  Count back from 20 0 points  Months in reverse 0 points  Repeat phrase 2 points  Total Score 2 points    Immunizations Immunization History  Administered Date(s) Administered   Tdap 12/06/2008    TDAP status: Due, Education has been provided regarding the importance of this vaccine. Advised may receive this vaccine at local pharmacy or Health Dept. Aware to provide a copy of the vaccination record if obtained from local pharmacy or Health Dept. Verbalized acceptance and understanding.  Flu Vaccine status: Declined, Education has been provided regarding the importance of this vaccine but patient still declined. Advised may receive this vaccine at local pharmacy or Health Dept. Aware to provide a copy of the vaccination record if obtained from local pharmacy or Health Dept. Verbalized acceptance and understanding.  Pneumococcal vaccine status: Declined,  Education has been provided regarding the importance of this vaccine but patient still declined. Advised may receive this vaccine at local pharmacy or Health Dept. Aware to provide a copy of the vaccination record  if obtained from local pharmacy or Health Dept. Verbalized acceptance and understanding.   Covid-19 vaccine status: Declined, Education has been provided regarding the importance of this vaccine but patient still declined. Advised may receive this vaccine at local pharmacy or Health Dept.or vaccine clinic. Aware to provide a copy of the vaccination record if obtained from local pharmacy or Health Dept. Verbalized acceptance and understanding.  Qualifies for Shingles Vaccine? Yes   Zostavax completed No   Shingrix Completed?: No.    Education has been provided regarding the importance of this vaccine. Patient has been advised to call insurance company to determine out of pocket expense  if they have not yet received this vaccine. Advised may also receive vaccine at local pharmacy or Health Dept. Verbalized acceptance and understanding.  Screening Tests Health Maintenance  Topic Date Due   COVID-19 Vaccine (1) Never done   Colonoscopy  Never done   MAMMOGRAM  Never done   Zoster Vaccines- Shingrix (1 of 2) Never done   DTaP/Tdap/Td (2 - Td or Tdap) 12/07/2018   DEXA SCAN  Never done   Pneumonia Vaccine 22+ Years old (1 of 2 - PCV) 11/16/2022 (Originally 08/12/1961)   INFLUENZA VACCINE  10/30/2022   Medicare Annual Wellness (AWV)  09/29/2023   Hepatitis C Screening  Completed   HPV VACCINES  Aged Out    Health Maintenance  Health Maintenance Due  Topic Date Due   COVID-19 Vaccine (1) Never done   Colonoscopy  Never done   MAMMOGRAM  Never done   Zoster Vaccines- Shingrix (1 of 2) Never done   DTaP/Tdap/Td (2 - Td or Tdap) 12/07/2018   DEXA SCAN  Never done    Declined referral for colonoscopy  Mammogram status: Ordered 11/15/21. Pt provided with contact info and advised to call to schedule appt.   Bone Density status: Ordered 11/15/21. Pt provided with contact info and advised to call to schedule appt.  Lung Cancer Screening: (Low Dose CT Chest recommended if Age 63-80 years, 20 pack-year currently smoking OR have quit w/in 15years.) does not qualify.   Additional Screening:  Hepatitis C Screening: does qualify; Completed 8/30/2f  Vision Screening: Recommended annual ophthalmology exams for early detection of glaucoma and other disorders of the eye. Is the patient up to date with their annual eye exam?  No  Who is the provider or what is the name of the office in which the patient attends annual eye exams? No one If pt is not established with a provider, would they like to be referred to a provider to establish care? No .   Dental Screening: Recommended annual dental exams for proper oral hygiene   Community Resource Referral / Chronic Care  Management: CRR required this visit?  No   CCM required this visit?  No     Plan:     I have personally reviewed and noted the following in the patient's chart:   Medical and social history Use of alcohol, tobacco or illicit drugs  Current medications and supplements including opioid prescriptions. Patient is not currently taking opioid prescriptions. Functional ability and status Nutritional status Physical activity Advanced directives List of other physicians Hospitalizations, surgeries, and ER visits in previous 12 months Vitals Screenings to include cognitive, depression, and falls Referrals and appointments  In addition, I have reviewed and discussed with patient certain preventive protocols, quality metrics, and best practice recommendations. A written personalized care plan for preventive services as well as general preventive health recommendations were provided to  patient.     Hal Hope, LPN   08/03/3873   After Visit Summary: (MyChart) Due to this being a telephonic visit, the after visit summary with patients personalized plan was offered to patient via MyChart   Nurse Notes: none

## 2022-09-29 NOTE — Patient Instructions (Signed)
Vicki Rosales , Thank you for taking time to come for your Medicare Wellness Visit. I appreciate your ongoing commitment to your health goals. Please review the following plan we discussed and let me know if I can assist you in the future.   These are the goals we discussed:  Goals       "I can't afford my medications" (pt-stated)      Current Barriers:  Financial barriers - patient is uninsured and and has been unable to afford clopidogrel or pravastatin. Has continued aspirin 325 mg daily  Hx CVA x 3; previously seen by neurology and cardiology  Pharmacist Clinical Goal(s):  Over the next 30 days, patient will work with pharmacist and primary care team to address needs related to medication access  Interventions: Comprehensive medication review performed.  Explained [facility] benefit for uninsured patients of [facility]. Will contact pharmacists at [facility] and ask to outreach patient to discuss financial documentation needed to screen for eligibility Will collaborate with primary care provider Aura Dials, NP to have all chronic medications sent to [facility]; added to preferred pharmacies  Patient Self Care Activities:  Currently UNABLE TO independently afford and remain adherent to prescribed medications  Initial goal documentation       DIET - EAT MORE FRUITS AND VEGETABLES      I need to decrease my migraines before returning to work (pt-stated)      Current Barriers:  Knowledge Deficits related to how to reduce occular migraines Financial Constraints.   Nurse Case Manager Clinical Goal(s):  Over the next 90 days, patient will work with [facility] to address needs related to reducing occular migraines  Interventions:  Co-visit at PCP office with CCM Pharm-D Discussed plans with patient for ongoing care management follow up and provided patient with direct contact information for care management team Reviewed past medical history  Reviewed migraine triggers and  prevention  Reviewed stressors  Followed up with patient to ensure she was able to obtain Neurology appointment Patient had some medication questions, will message [facility] Pharm- D to address these.   Patient Self Care Activities:  Currently UNABLE TO independently control migraines causing stress related to patient returning to work 09/27/18 Performs ADL's independently Performs IADL's independently  Initial goal documentation         This is a list of the screening recommended for you and due dates:  Health Maintenance  Topic Date Due   COVID-19 Vaccine (1) Never done   Colon Cancer Screening  Never done   Mammogram  Never done   Zoster (Shingles) Vaccine (1 of 2) Never done   DTaP/Tdap/Td vaccine (2 - Td or Tdap) 12/07/2018   DEXA scan (bone density measurement)  Never done   Pneumonia Vaccine (1 of 2 - PCV) 11/16/2022*   Flu Shot  10/30/2022   Medicare Annual Wellness Visit  09/29/2023   Hepatitis C Screening  Completed   HPV Vaccine  Aged Out  *Topic was postponed. The date shown is not the original due date.    Advanced directives: no  Conditions/risks identified: none  Next appointment: Follow up in one year for your annual wellness visit 10/05/23 @ 2:00 pm by phone   Preventive Care 65 Years and Older, Female Preventive care refers to lifestyle choices and visits with your health care provider that can promote health and wellness. What does preventive care include? A yearly physical exam. This is also called an annual well check. Dental exams once or twice a year.  Routine eye exams. Ask your health care provider how often you should have your eyes checked. Personal lifestyle choices, including: Daily care of your teeth and gums. Regular physical activity. Eating a healthy diet. Avoiding tobacco and drug use. Limiting alcohol use. Practicing safe sex. Taking low-dose aspirin every day. Taking vitamin and mineral supplements as recommended by your health care  provider. What happens during an annual well check? The services and screenings done by your health care provider during your annual well check will depend on your age, overall health, lifestyle risk factors, and family history of disease. Counseling  Your health care provider may ask you questions about your: Alcohol use. Tobacco use. Drug use. Emotional well-being. Home and relationship well-being. Sexual activity. Eating habits. History of falls. Memory and ability to understand (cognition). Work and work Astronomer. Reproductive health. Screening  You may have the following tests or measurements: Height, weight, and BMI. Blood pressure. Lipid and cholesterol levels. These may be checked every 5 years, or more frequently if you are over 46 years old. Skin check. Lung cancer screening. You may have this screening every year starting at age 61 if you have a 30-pack-year history of smoking and currently smoke or have quit within the past 15 years. Fecal occult blood test (FOBT) of the stool. You may have this test every year starting at age 81. Flexible sigmoidoscopy or colonoscopy. You may have a sigmoidoscopy every 5 years or a colonoscopy every 10 years starting at age 48. Hepatitis C blood test. Hepatitis B blood test. Sexually transmitted disease (STD) testing. Diabetes screening. This is done by checking your blood sugar (glucose) after you have not eaten for a while (fasting). You may have this done every 1-3 years. Bone density scan. This is done to screen for osteoporosis. You may have this done starting at age 50. Mammogram. This may be done every 1-2 years. Talk to your health care provider about how often you should have regular mammograms. Talk with your health care provider about your test results, treatment options, and if necessary, the need for more tests. Vaccines  Your health care provider may recommend certain vaccines, such as: Influenza vaccine. This is  recommended every year. Tetanus, diphtheria, and acellular pertussis (Tdap, Td) vaccine. You may need a Td booster every 10 years. Zoster vaccine. You may need this after age 58. Pneumococcal 13-valent conjugate (PCV13) vaccine. One dose is recommended after age 45. Pneumococcal polysaccharide (PPSV23) vaccine. One dose is recommended after age 2. Talk to your health care provider about which screenings and vaccines you need and how often you need them. This information is not intended to replace advice given to you by your health care provider. Make sure you discuss any questions you have with your health care provider. Document Released: 04/13/2015 Document Revised: 12/05/2015 Document Reviewed: 01/16/2015 Elsevier Interactive Patient Education  2017 [employer].  Fall Prevention in the Home Falls can cause injuries. They can happen to people of all ages. There are many things you can do to make your home safe and to help prevent falls. What can I do on the outside of my home? Regularly fix the edges of walkways and driveways and fix any cracks. Remove anything that might make you trip as you walk through a door, such as a raised step or threshold. Trim any bushes or trees on the path to your home. Use bright outdoor lighting. Clear any walking paths of anything that might make someone trip, such as rocks or tools.  Regularly check to see if handrails are loose or broken. Make sure that both sides of any steps have handrails. Any raised decks and porches should have guardrails on the edges. Have any leaves, snow, or ice cleared regularly. Use sand or salt on walking paths during winter. Clean up any spills in your garage right away. This includes oil or grease spills. What can I do in the bathroom? Use night lights. Install grab bars by the toilet and in the tub and shower. Do not use towel bars as grab bars. Use non-skid mats or decals in the tub or shower. If you need to sit down in  the shower, use a plastic, non-slip stool. Keep the floor dry. Clean up any water that spills on the floor as soon as it happens. Remove soap buildup in the tub or shower regularly. Attach bath mats securely with double-sided non-slip rug tape. Do not have throw rugs and other things on the floor that can make you trip. What can I do in the bedroom? Use night lights. Make sure that you have a light by your bed that is easy to reach. Do not use any sheets or blankets that are too big for your bed. They should not hang down onto the floor. Have a firm chair that has side arms. You can use this for support while you get dressed. Do not have throw rugs and other things on the floor that can make you trip. What can I do in the kitchen? Clean up any spills right away. Avoid walking on wet floors. Keep items that you use a lot in easy-to-reach places. If you need to reach something above you, use a strong step stool that has a grab bar. Keep electrical cords out of the way. Do not use floor polish or wax that makes floors slippery. If you must use wax, use non-skid floor wax. Do not have throw rugs and other things on the floor that can make you trip. What can I do with my stairs? Do not leave any items on the stairs. Make sure that there are handrails on both sides of the stairs and use them. Fix handrails that are broken or loose. Make sure that handrails are as long as the stairways. Check any carpeting to make sure that it is firmly attached to the stairs. Fix any carpet that is loose or worn. Avoid having throw rugs at the top or bottom of the stairs. If you do have throw rugs, attach them to the floor with carpet tape. Make sure that you have a light switch at the top of the stairs and the bottom of the stairs. If you do not have them, ask someone to add them for you. What else can I do to help prevent falls? Wear shoes that: Do not have high heels. Have rubber bottoms. Are comfortable  and fit you well. Are closed at the toe. Do not wear sandals. If you use a stepladder: Make sure that it is fully opened. Do not climb a closed stepladder. Make sure that both sides of the stepladder are locked into place. Ask someone to hold it for you, if possible. Clearly mark and make sure that you can see: Any grab bars or handrails. First and last steps. Where the edge of each step is. Use tools that help you move around (mobility aids) if they are needed. These include: Canes. Walkers. Scooters. Crutches. Turn on the lights when you go into a dark area. Replace any  light bulbs as soon as they burn out. Set up your furniture so you have a clear path. Avoid moving your furniture around. If any of your floors are uneven, fix them. If there are any pets around you, be aware of where they are. Review your medicines with your doctor. Some medicines can make you feel dizzy. This can increase your chance of falling. Ask your doctor what other things that you can do to help prevent falls. This information is not intended to replace advice given to you by your health care provider. Make sure you discuss any questions you have with your health care provider. Document Released: 01/11/2009 Document Revised: 08/23/2015 Document Reviewed: 04/21/2014 Elsevier Interactive Patient Education  2017 [employer].

## 2022-12-10 ENCOUNTER — Other Ambulatory Visit: Payer: Self-pay | Admitting: Nurse Practitioner

## 2022-12-11 NOTE — Telephone Encounter (Signed)
Requested medication (s) are due for refill today:  Yes for all 3  Requested medication (s) are on the active medication list:   Yes for all 3  Future visit scheduled:   Yes 9/20 with Jolene   Last ordered: All 3  11/15/2021 #90, 4 refills  Returned because labs and an OV are due per protocol so unable to refill   Requested Prescriptions  Pending Prescriptions Disp Refills   clopidogrel (PLAVIX) 75 MG tablet [Pharmacy Med Name: Clopidogrel Bisulfate 75 MG Oral Tablet] 30 tablet 0    Sig: Take 1 tablet by mouth once daily     Hematology: Antiplatelets - clopidogrel Failed - 12/10/2022 10:51 AM      Failed - HCT in normal range and within 180 days    HCT  Date Value Ref Range Status  12/26/2021 45.6 36.0 - 46.0 % Final   Hematocrit  Date Value Ref Range Status  11/15/2021 41.8 34.0 - 46.6 % Final         Failed - HGB in normal range and within 180 days    Hemoglobin  Date Value Ref Range Status  12/26/2021 14.2 12.0 - 15.0 g/dL Final  40/98/1191 47.8 11.1 - 15.9 g/dL Final         Failed - PLT in normal range and within 180 days    Platelets  Date Value Ref Range Status  12/26/2021 122 (L) 150 - 400 K/uL Final  11/15/2021 CANCELED x10E3/uL     Comment:    Unable to perform an accurate platelet count due to aggregation of the platelets.  Result canceled by the ancillary.          Failed - Valid encounter within last 6 months    Recent Outpatient Visits           1 year ago Mild episode of recurrent major depressive disorder (HCC)   [place] [facility] [place], Chase T, NP   2 years ago Mild episode of recurrent major depressive disorder (HCC)   [place] [facility] [place], Corrie Dandy T, NP   3 years ago Other hypertrophic cardiomyopathy (HCC)   [place] [facility] [place], [place] T, NP   4 years ago Ocular migraine   [place] [facility] [place], Palmetto T, NP   4 years ago Acute maxillary sinusitis,  recurrence not specified   [place] [facility] Part[place], [place]       Future Appointments             In 1 week Marjie Skiff, NP [facility]            Passed - Cr in normal range and within 360 days    Creatinine  Date Value Ref Range Status  07/06/2012 0.87 0.60 - 1.30 mg/dL Final   Creatinine, Ser  Date Value Ref Range Status  12/26/2021 0.90 0.44 - 1.00 mg/dL Final          losartan (COZAAR) 25 MG tablet [Pharmacy Med Name: Losartan Potassium 25 MG Oral Tablet] 30 tablet 0    Sig: Take 1 tablet by mouth once daily     Cardiovascular:  Angiotensin Receptor Blockers Failed - 12/10/2022 10:51 AM      Failed - Cr in normal range and within 180 days    Creatinine  Date Value Ref Range Status  07/06/2012 0.87 0.60 - 1.30 mg/dL Final   Creatinine, Ser  Date Value  Ref Range Status  12/26/2021 0.90 0.44 - 1.00 mg/dL Final         Failed - K in normal range and within 180 days    Potassium  Date Value Ref Range Status  12/26/2021 3.3 (L) 3.5 - 5.1 mmol/L Final  07/06/2012 3.8 3.5 - 5.1 mmol/L Final         Failed - Last BP in normal range    BP Readings from Last 1 Encounters:  11/15/21 126/68         Failed - Valid encounter within last 6 months    Recent Outpatient Visits           1 year ago Mild episode of recurrent major depressive disorder (HCC)   [place] [facility] [place], Corrie Dandy T, NP   2 years ago Mild episode of recurrent major depressive disorder (HCC)   [place] [facility] [place], Corrie Dandy T, NP   3 years ago Other hypertrophic cardiomyopathy (HCC)   [place] [facility] [place], Corrie Dandy T, NP   4 years ago Ocular migraine   [place] [facility] [place], [place] T, NP   4 years ago Acute maxillary sinusitis, recurrence not specified   [place] [facility] [place], Salley Hews, [place]       Future  Appointments             In 1 week Aura Dials T, NP [place] [facility]            Passed - Patient is not pregnant       sertraline (ZOLOFT) 100 MG tablet [Pharmacy Med Name: Sertraline HCl 100 MG Oral Tablet] 30 tablet 0    Sig: Take 1 tablet by mouth once daily     Psychiatry:  Antidepressants - SSRI - sertraline Failed - 12/10/2022 10:51 AM      Failed - Valid encounter within last 6 months    Recent Outpatient Visits           1 year ago Mild episode of recurrent major depressive disorder (HCC)   [place] [facility] [place], Atalissa T, NP   2 years ago Mild episode of recurrent major depressive disorder (HCC)   [place] [facility] [place], Corrie Dandy T, NP   3 years ago Other hypertrophic cardiomyopathy (HCC)   [place] [facility] [place], Corrie Dandy T, NP   4 years ago Ocular migraine   [place] [facility] [place], [place] T, NP   4 years ago Acute maxillary sinusitis, recurrence not specified   [place] [facility] [place], Salley Hews, [place]       Future Appointments             In 1 week [place], Corrie Dandy T, NP  [facility], [facility]            Passed - AST in normal range and within 360 days    AST  Date Value Ref Range Status  12/26/2021 19 15 - 41 U/L Final   SGOT(AST)  Date Value Ref Range Status  07/06/2012 22 15 - 37 Unit/L Final         Passed - ALT in normal range and within 360 days    ALT  Date Value Ref Range Status  12/26/2021 10 0 - 44 U/L Final   SGPT (ALT)  Date Value Ref Range Status  07/06/2012 15 12 - 78 U/L Final  Passed - Completed PHQ-2 or PHQ-9 in the last 360 days

## 2022-12-14 NOTE — Patient Instructions (Signed)

## 2022-12-19 ENCOUNTER — Telehealth (INDEPENDENT_AMBULATORY_CARE_PROVIDER_SITE_OTHER): Payer: Medicare Other | Admitting: Nurse Practitioner

## 2022-12-19 ENCOUNTER — Encounter: Payer: Self-pay | Admitting: Nurse Practitioner

## 2022-12-19 DIAGNOSIS — E7800 Pure hypercholesterolemia, unspecified: Secondary | ICD-10-CM

## 2022-12-19 DIAGNOSIS — F330 Major depressive disorder, recurrent, mild: Secondary | ICD-10-CM

## 2022-12-19 DIAGNOSIS — G2581 Restless legs syndrome: Secondary | ICD-10-CM

## 2022-12-19 DIAGNOSIS — I422 Other hypertrophic cardiomyopathy: Secondary | ICD-10-CM | POA: Diagnosis not present

## 2022-12-19 DIAGNOSIS — F32A Depression, unspecified: Secondary | ICD-10-CM | POA: Diagnosis not present

## 2022-12-19 DIAGNOSIS — E785 Hyperlipidemia, unspecified: Secondary | ICD-10-CM | POA: Diagnosis not present

## 2022-12-19 DIAGNOSIS — R7309 Other abnormal glucose: Secondary | ICD-10-CM

## 2022-12-19 DIAGNOSIS — F411 Generalized anxiety disorder: Secondary | ICD-10-CM

## 2022-12-19 DIAGNOSIS — I10 Essential (primary) hypertension: Secondary | ICD-10-CM | POA: Diagnosis not present

## 2022-12-19 DIAGNOSIS — F17210 Nicotine dependence, cigarettes, uncomplicated: Secondary | ICD-10-CM

## 2022-12-19 DIAGNOSIS — Z8673 Personal history of transient ischemic attack (TIA), and cerebral infarction without residual deficits: Secondary | ICD-10-CM

## 2022-12-19 MED ORDER — CLOPIDOGREL BISULFATE 75 MG PO TABS: 75.0000 mg | ORAL_TABLET | Freq: Every day | ORAL | 1 refills | Status: DC

## 2022-12-19 MED ORDER — ROPINIROLE HCL 2 MG PO TABS: 2.0000 mg | ORAL_TABLET | Freq: Every day | ORAL | 2 refills | Status: DC

## 2022-12-19 MED ORDER — LOSARTAN POTASSIUM 25 MG PO TABS: 25.0000 mg | ORAL_TABLET | Freq: Every day | ORAL | 1 refills | Status: DC

## 2022-12-19 MED ORDER — SERTRALINE HCL 100 MG PO TABS: 100.0000 mg | ORAL_TABLET | Freq: Every day | ORAL | 1 refills | Status: DC

## 2022-12-19 MED ORDER — ROSUVASTATIN CALCIUM 10 MG PO TABS: 10.0000 mg | ORAL_TABLET | Freq: Every day | ORAL | 1 refills | Status: DC

## 2022-12-19 MED ORDER — ASPIRIN 81 MG PO CHEW: 81.0000 mg | CHEWABLE_TABLET | Freq: Every day | ORAL | 1 refills | Status: DC

## 2022-12-19 NOTE — Assessment & Plan Note (Signed)
Chronic, stable.  BP was at goal last office visit.  Recommend she monitor BP at least a few mornings a week at home and document.  DASH diet at home.  Continue current medication regimen and adjust as needed.  Labs today: CBC, CMP, TSH outpatient.  Return in 6 months in office.  Refills sent in.

## 2022-12-19 NOTE — Progress Notes (Signed)
There were no vitals taken for this visit.   Subjective:    Patient ID: Vicki Rosales, female    DOB: 02-02-1956, 67 y.o.   MRN: [id number]  HPI: [name] is a 67 y.o. female  Chief Complaint  Patient presents with   Anxiety   Depression   Hyperlipidemia   Hypertension   Virtual Visit via Video Note  I connected with Vicki Rosales on 12/19/22 at 10:20 AM EDT by a video enabled telemedicine application and verified that I am speaking with the correct person using two identifiers.  Location: Patient: home Provider: work   I discussed the limitations of evaluation and management by telemedicine and the availability of in person appointments. The patient expressed understanding and agreed to proceed.  I discussed the assessment and treatment plan with the patient. The patient was provided an opportunity to ask questions and all were answered. The patient agreed with the plan and demonstrated an understanding of the instructions.   The patient was advised to call back or seek an in-person evaluation if the symptoms worsen or if the condition fails to improve as anticipated.  I provided 21 minutes of non-face-to-face time during this encounter.   Marjie Skiff, NP   Lost to follow-up, she missed 6 month follow-up visit.  HYPERTENSION / HYPERLIPIDEMIA Continues Rosuvastatin 10 MG daily + Plavix 75 MG and Losartan 25 MG -- occasionally misses doses.  History of CVA's 2007, 2009, 2014. Has underlying hypertrophic cardiomyopathy.  Last A1c in office was prediabetic at 6%.  Current smoker, smokes about 1 PPD, sometimes less -- has smoked since her teen years -- has tried to quit in past. Satisfied with current treatment? yes Duration of hypertension: chronic BP monitoring frequency: not checking BP range:  BP medication side effects: no Duration of hyperlipidemia: chronic Cholesterol medication side effects: no Cholesterol supplements: none Medication  compliance: poor compliance Aspirin: yes Recent stressors: no Recurrent headaches: no Visual changes: no Palpitations: no Dyspnea: no Chest pain: no Lower extremity edema: no Dizzy/lightheaded: no    RESTLESS LEG Continues on Ropinirole 2 MG QHS.  This continues to offer good benefit to her, she has tried to go without it -- but symptoms returned. Duration: chronic Discomfort description: crawling Pain: no Location: lower legs Bilateral: yes Symmetric: yes Severity: moderate Onset:  gradual Frequency:  intermittent Symptoms only occur while legs at rest: yes Sudden unintentional leg jerking: no Bed partner bothered by leg movements: no LE numbness: no Decreased sensation: no Weakness: no Insomnia: no Daytime somnolence: no Fatigue: no Alleviating factors: Requip Aggravating factors: rest Status: stable Treatments attempted: Requip  ANXIETY/STRESS Continues on Sertraline 100 MG daily + Buspar (rarely takes, does not like taking medication).  She is both parent and grand parent at this time -- she is babysitter. Duration: stable Anxious mood: occasional Excessive worrying: no Irritability: no  Sweating: no Nausea: no Palpitations:no Hyperventilation: no Panic attacks: no Agoraphobia: no  Obscessions/compulsions: no Depressed mood: no    12/19/2022   10:23 AM 09/29/2022    3:31 PM 11/15/2021   11:08 AM 09/21/2020   10:57 AM 11/28/2019    9:43 AM  Depression screen PHQ 2/9  Decreased Interest 0 0 0 0 0  Down, Depressed, Hopeless 0 0 0 0 0  PHQ - 2 Score 0 0 0 0 0  Altered sleeping 0 0 1 0 0  Tired, decreased energy 1 0 0 0 0  Change in appetite 0 0 0 0 0  Feeling bad or failure about yourself  0 0 0 0 0  Trouble concentrating 0 0 0 0 0  Moving slowly or fidgety/restless 0 0 0 0 0  Suicidal thoughts 0 0 0 0 0  PHQ-9 Score 1 0 1 0 0  Difficult doing work/chores Not difficult at all Not difficult at all Not difficult at all Not difficult at all   Anhedonia:  no Weight changes: no Insomnia: none Hypersomnia: no Fatigue/loss of energy: no Feelings of worthlessness: no Feelings of guilt: no Impaired concentration/indecisiveness: no Suicidal ideations: no  Crying spells: no Recent Stressors/Life Changes: no   Relationship problems: no   Family stress: no     Financial stress: no    Job stress: no    Recent death/loss: no    2023-01-01   10:24 AM 11/15/2021   11:08 AM 11/28/2019    9:44 AM  GAD 7 : Generalized Anxiety Score  Nervous, Anxious, on Edge 0 0 0  Control/stop worrying 1 1 0  Worry too much - different things 0 1 0  Trouble relaxing 0 0 0  Restless 0 0 0  Easily annoyed or irritable 0 0 0  Afraid - awful might happen 0 0 0  Total GAD 7 Score 1 2 0  Anxiety Difficulty Not difficult at all Not difficult at all     Relevant past medical, surgical, family and social history reviewed and updated as indicated. Interim medical history since our last visit reviewed. Allergies and medications reviewed and updated.  Review of Systems  Constitutional:  Negative for activity change, appetite change, diaphoresis, fatigue and fever.  Respiratory:  Negative for cough, chest tightness and shortness of breath.   Cardiovascular:  Negative for chest pain, palpitations and leg swelling.  Gastrointestinal: Negative.   Endocrine: Negative for cold intolerance, heat intolerance, polydipsia, polyphagia and polyuria.  Neurological: Negative.   Psychiatric/Behavioral:  Negative for decreased concentration, self-injury, sleep disturbance and suicidal ideas. The patient is not nervous/anxious.     Per HPI unless specifically indicated above     Objective:    There were no vitals taken for this visit.  Wt Readings from Last 3 Encounters:  09/29/22 143 lb (64.9 kg)  11/15/21 143 lb 1.6 oz (64.9 kg)  05/29/21 140 lb (63.5 kg)    Physical Exam Vitals and nursing note reviewed.  Constitutional:      General: She is awake. She is not in acute  distress.    Appearance: Normal appearance. She is well-developed and well-groomed. She is not ill-appearing or toxic-appearing.  HENT:     Head: Normocephalic.     Right Ear: Hearing normal.     Left Ear: Hearing normal.  Eyes:     General: Lids are normal.        Right eye: No discharge.        Left eye: No discharge.     Conjunctiva/sclera: Conjunctivae normal.  Pulmonary:     Effort: Pulmonary effort is normal. No accessory muscle usage or respiratory distress.  Musculoskeletal:     Cervical back: Normal range of motion.  Neurological:     Mental Status: She is alert and oriented to person, place, and time.  Psychiatric:        Attention and Perception: Attention normal.        Mood and Affect: Mood normal.        Behavior: Behavior normal. Behavior is cooperative.        Thought Content: Thought content normal.  Judgment: Judgment normal.    Results for orders placed or performed in visit on 11/15/21  CBC with Differential/Platelet  Result Value Ref Range   WBC 8.2 3.4 - 10.8 x10E3/uL   RBC 4.77 3.77 - 5.28 x10E6/uL   Hemoglobin 13.9 11.1 - 15.9 g/dL   Hematocrit 69.6 29.5 - 46.6 %   MCV 88 79 - 97 fL   MCH 29.1 26.6 - 33.0 pg   MCHC 33.3 31.5 - 35.7 g/dL   RDW 28.4 13.2 - 44.0 %   Platelets CANCELED x10E3/uL   Neutrophils 65 Not Estab. %   Lymphs 25 Not Estab. %   Monocytes 7 Not Estab. %   Eos 2 Not Estab. %   Basos 1 Not Estab. %   Neutrophils Absolute 5.3 1.4 - 7.0 x10E3/uL   Lymphocytes Absolute 2.1 0.7 - 3.1 x10E3/uL   Monocytes Absolute 0.6 0.1 - 0.9 x10E3/uL   EOS (ABSOLUTE) 0.2 0.0 - 0.4 x10E3/uL   Basophils Absolute 0.1 0.0 - 0.2 x10E3/uL   Immature Granulocytes 0 Not Estab. %   Immature Grans (Abs) 0.0 0.0 - 0.1 x10E3/uL   Hematology Comments: Note:   Comprehensive metabolic panel  Result Value Ref Range   Glucose 92 70 - 99 mg/dL   BUN 9 8 - 27 mg/dL   Creatinine, Ser 1.02 0.57 - 1.00 mg/dL   eGFR 63 >72 ZD/GUY/4.03   BUN/Creatinine Ratio  9 (L) 12 - 28   Sodium 142 134 - 144 mmol/L   Potassium 4.3 3.5 - 5.2 mmol/L   Chloride 104 96 - 106 mmol/L   CO2 24 20 - 29 mmol/L   Calcium 9.7 8.7 - 10.3 mg/dL   Total Protein 7.1 6.0 - 8.5 g/dL   Albumin 4.1 3.9 - 4.9 g/dL   Globulin, Total 3.0 1.5 - 4.5 g/dL   Albumin/Globulin Ratio 1.4 1.2 - 2.2   Bilirubin Total 0.4 0.0 - 1.2 mg/dL   Alkaline Phosphatase 114 44 - 121 IU/L   AST 14 0 - 40 IU/L   ALT 6 0 - 32 IU/L  Lipid Panel w/o Chol/HDL Ratio  Result Value Ref Range   Cholesterol, Total 189 100 - 199 mg/dL   Triglycerides 474 0 - 149 mg/dL   HDL 52 >25 mg/dL   VLDL Cholesterol Cal 24 5 - 40 mg/dL   LDL Chol Calc (NIH) 956 (H) 0 - 99 mg/dL  TSH  Result Value Ref Range   TSH 0.984 0.450 - 4.500 uIU/mL  HgB A1c  Result Value Ref Range   Hgb A1c MFr Bld 6.0 (H) 4.8 - 5.6 %   Est. average glucose Bld gHb Est-mCnc 126 mg/dL  Magnesium  Result Value Ref Range   Magnesium 2.2 1.6 - 2.3 mg/dL      Assessment & Plan:   Problem List Items Addressed This Visit       Cardiovascular and Mediastinum   Essential hypertension, benign    Chronic, stable.  BP was at goal last office visit.  Recommend she monitor BP at least a few mornings a week at home and document.  DASH diet at home.  Continue current medication regimen and adjust as needed.  Labs today: CBC, CMP, TSH outpatient.  Return in 6 months in office.  Refills sent in.       Relevant Medications   rosuvastatin (CRESTOR) 10 MG tablet   losartan (COZAAR) 25 MG tablet   aspirin 81 MG chewable tablet   Other Relevant Orders   CBC  with Differential/Platelet   TSH   Other hypertrophic cardiomyopathy (HCC)    Ongoing, stable.  Continue ASA daily and recommend she take statin as instructed.  Would benefit return to cardiology, declines at this time.  Recommend complete cessation of smoking.      Relevant Medications   rosuvastatin (CRESTOR) 10 MG tablet   losartan (COZAAR) 25 MG tablet   aspirin 81 MG chewable  tablet     Other   Depression - Primary    Chronic, stable.  Denies SI/HI.  Continue Sertraline daily and adjust dose as needed + continue Buspar 5 MG BID PRN.  Refills sent in.  Recommend notify provider immediately if worsening mood.  Not to borrow relatives medications.  Return in 6 months, sooner if needed.      Relevant Medications   sertraline (ZOLOFT) 100 MG tablet   Elevated hemoglobin A1c measurement    Noted on past labs, recheck outpatient and treat as needed.  For now continue healthy diet and exercise focus.      Relevant Orders   HgB A1c   Generalized anxiety disorder    Refer to depression plan of care.      Relevant Medications   sertraline (ZOLOFT) 100 MG tablet   History of CVA (cerebrovascular accident)    CVA's in 2007, 2009, 2014.   WNL neuro exam today virtually.  Educated her at length of stroke prevention goals including LDL <70 and BP <130/80.  Reiterated importance of taking medication daily.      Hyperlipidemia    Chronic, history of multiple strokes.  Continue Rosuvastatin daily.  Script sent. Lipid panel outpatient.  Return in 6 months in office.      Relevant Medications   rosuvastatin (CRESTOR) 10 MG tablet   losartan (COZAAR) 25 MG tablet   aspirin 81 MG chewable tablet   Other Relevant Orders   Comprehensive metabolic panel   Lipid Panel w/o Chol/HDL Ratio   Nicotine dependence, cigarettes, uncomplicated    I have recommended complete cessation of tobacco use. I have discussed various options available for assistance with tobacco cessation including over the counter methods (Nicotine gum, patch and lozenges). We also discussed prescription options (Chantix, Nicotine Inhaler / Nasal Spray). The patient is not interested in pursuing any prescription tobacco cessation options at this time.  Lung cancer screening order sent.       Relevant Orders   Ambulatory Referral for Lung Cancer Scre   RLS (restless legs syndrome)    Chronic, stable with  Requip.  Refills sent in.  Will adjust dose as needed.  Recommend Magnesium 400 MG at night.         Follow up plan: Return in about 6 months (around 06/18/2023) for HTN/HLD, MOOD, RLS in office visit.

## 2022-12-19 NOTE — Assessment & Plan Note (Signed)
Refer to depression plan of care.

## 2022-12-19 NOTE — Assessment & Plan Note (Signed)
Chronic, stable with Requip.  Refills sent in.  Will adjust dose as needed.  Recommend Magnesium 400 MG at night.

## 2022-12-19 NOTE — Assessment & Plan Note (Signed)
CVA's in 2007, 2009, 2014.   WNL neuro exam today virtually.  Educated her at length of stroke prevention goals including LDL <70 and BP <130/80.  Reiterated importance of taking medication daily.

## 2022-12-19 NOTE — Progress Notes (Signed)
2 weeks Labs and 6 month follow up scheduled

## 2022-12-19 NOTE — Assessment & Plan Note (Signed)
Noted on past labs, recheck outpatient and treat as needed.  For now continue healthy diet and exercise focus.

## 2022-12-19 NOTE — Assessment & Plan Note (Signed)
I have recommended complete cessation of tobacco use. I have discussed various options available for assistance with tobacco cessation including over the counter methods (Nicotine gum, patch and lozenges). We also discussed prescription options (Chantix, Nicotine Inhaler / Nasal Spray). The patient is not interested in pursuing any prescription tobacco cessation options at this time.  Lung cancer screening order sent.

## 2022-12-19 NOTE — Assessment & Plan Note (Signed)
Ongoing, stable.  Continue ASA daily and recommend she take statin as instructed.  Would benefit return to cardiology, declines at this time.  Recommend complete cessation of smoking.

## 2022-12-19 NOTE — Assessment & Plan Note (Signed)
Chronic, stable.  Denies SI/HI.  Continue Sertraline daily and adjust dose as needed + continue Buspar 5 MG BID PRN.  Refills sent in.  Recommend notify provider immediately if worsening mood.  Not to borrow relatives medications.  Return in 6 months, sooner if needed.

## 2022-12-19 NOTE — Assessment & Plan Note (Signed)
Chronic, history of multiple strokes.  Continue Rosuvastatin daily.  Script sent. Lipid panel outpatient.  Return in 6 months in office.

## 2022-12-31 ENCOUNTER — Other Ambulatory Visit: Payer: Self-pay

## 2022-12-31 NOTE — Telephone Encounter (Signed)
Entered in error

## 2023-01-02 ENCOUNTER — Other Ambulatory Visit: Payer: Medicare Other

## 2023-01-08 ENCOUNTER — Other Ambulatory Visit: Payer: Medicare Other

## 2023-01-08 DIAGNOSIS — R7309 Other abnormal glucose: Secondary | ICD-10-CM

## 2023-01-08 DIAGNOSIS — I10 Essential (primary) hypertension: Secondary | ICD-10-CM

## 2023-01-08 DIAGNOSIS — E7800 Pure hypercholesterolemia, unspecified: Secondary | ICD-10-CM

## 2023-01-09 ENCOUNTER — Other Ambulatory Visit: Payer: Self-pay | Admitting: Nurse Practitioner

## 2023-01-09 ENCOUNTER — Telehealth: Payer: Self-pay | Admitting: Nurse Practitioner

## 2023-01-09 ENCOUNTER — Encounter: Payer: Self-pay | Admitting: Nurse Practitioner

## 2023-01-09 LAB — CBC WITH DIFFERENTIAL/PLATELET
Basophils Absolute: 0.1 10*3/uL (ref 0.0–0.2)
Basos: 1 %
EOS (ABSOLUTE): 0.2 10*3/uL (ref 0.0–0.4)
Eos: 2 %
Hematocrit: 44.2 % (ref 34.0–46.6)
Hemoglobin: 14.9 g/dL (ref 11.1–15.9)
Immature Grans (Abs): 0 10*3/uL (ref 0.0–0.1)
Immature Granulocytes: 0 %
Lymphocytes Absolute: 2.8 10*3/uL (ref 0.7–3.1)
Lymphs: 39 %
MCH: 30 pg (ref 26.6–33.0)
MCHC: 33.7 g/dL (ref 31.5–35.7)
MCV: 89 fL (ref 79–97)
Monocytes Absolute: 0.5 10*3/uL (ref 0.1–0.9)
Monocytes: 7 %
Neutrophils Absolute: 3.7 10*3/uL (ref 1.4–7.0)
Neutrophils: 51 %
RBC: 4.96 x10E6/uL (ref 3.77–5.28)
RDW: 13.3 % (ref 11.7–15.4)
WBC: 7.1 10*3/uL (ref 3.4–10.8)

## 2023-01-09 LAB — LIPID PANEL W/O CHOL/HDL RATIO
Cholesterol, Total: 208 mg/dL — ABNORMAL HIGH (ref 100–199)
HDL: 48 mg/dL (ref >39)
LDL Chol Calc (NIH): 124 mg/dL — ABNORMAL HIGH (ref 0–99)
Triglycerides: 207 mg/dL — ABNORMAL HIGH (ref 0–149)
VLDL Cholesterol Cal: 36 mg/dL (ref 5–40)

## 2023-01-09 LAB — COMPREHENSIVE METABOLIC PANEL
ALT: 10 [IU]/L (ref 0–32)
AST: 15 [IU]/L (ref 0–40)
Albumin: 4.2 g/dL (ref 3.9–4.9)
Alkaline Phosphatase: 101 [IU]/L (ref 44–121)
BUN/Creatinine Ratio: 17 (ref 12–28)
BUN: 18 mg/dL (ref 8–27)
Bilirubin Total: 0.4 mg/dL (ref 0.0–1.2)
CO2: 22 mmol/L (ref 20–29)
Calcium: 9.5 mg/dL (ref 8.7–10.3)
Chloride: 100 mmol/L (ref 96–106)
Creatinine, Ser: 1.04 mg/dL — ABNORMAL HIGH (ref 0.57–1.00)
Globulin, Total: 3 g/dL (ref 1.5–4.5)
Glucose: 85 mg/dL (ref 70–99)
Potassium: 4.5 mmol/L (ref 3.5–5.2)
Sodium: 137 mmol/L (ref 134–144)
Total Protein: 7.2 g/dL (ref 6.0–8.5)
eGFR: 59 mL/min/{1.73_m2} — ABNORMAL LOW (ref >59)

## 2023-01-09 LAB — TSH: TSH: 1.19 u[IU]/mL (ref 0.450–4.500)

## 2023-01-09 LAB — HEMOGLOBIN A1C
Est. average glucose Bld gHb Est-mCnc: 131 mg/dL
Hgb A1c MFr Bld: 6.2 % — ABNORMAL HIGH (ref 4.8–5.6)

## 2023-01-09 MED ORDER — ROSUVASTATIN CALCIUM 20 MG PO TABS: 20.0000 mg | ORAL_TABLET | Freq: Every day | ORAL | 3 refills | Status: DC

## 2023-01-09 NOTE — Telephone Encounter (Signed)
Pt wants to speak with Dr Harvest Dark about her low creatine results. She didn't want to speak with NT about it

## 2023-01-09 NOTE — Progress Notes (Signed)
Contacted via MyChart  Good afternoon Vicki Rosales, your labs have returned: - A1c is still prediabetic, but is creeping up to 6.2%, inching towards diabetes which is 6.5% or greater.  Focus heavily on diet changes and regular exercise. - Kidney function, creatinine and eGFR, shows some mild decline.  Ensure plenty of water intake at home and no Ibuprofen use.  Liver function, AST and ALT, are normal. - Lipid panel does show LDL remaining above goal for stroke prevention, I am increasing your Rosuvastatin to 20 MG and we will recheck next visit.  If any side effects with change let me know.   - Remainder of labs are stable.  Any questions? Keep being amazing!!  Thank you for allowing me to participate in your care.  I appreciate you. Kindest regards, Mailani Degroote

## 2023-01-09 NOTE — Telephone Encounter (Signed)
See my chart message

## 2023-01-19 ENCOUNTER — Other Ambulatory Visit: Payer: Self-pay | Admitting: Nurse Practitioner

## 2023-01-20 NOTE — Telephone Encounter (Signed)
Requested Prescriptions  Pending Prescriptions Disp Refills   busPIRone (BUSPAR) 5 MG tablet [Pharmacy Med Name: busPIRone HCl 5 MG Oral Tablet] 180 tablet 1    Sig: Take 1 tablet by mouth twice daily as needed     Psychiatry: Anxiolytics/Hypnotics - Non-controlled Passed - 01/19/2023  9:58 AM      Passed - Valid encounter within last 12 months    Recent Outpatient Visits           1 month ago Mild episode of recurrent major depressive disorder (HCC)   [place] [facility] [place], Corrie Dandy T, NP   1 year ago Mild episode of recurrent major depressive disorder (HCC)   [place] [facility] [place], [place] T, NP   2 years ago Mild episode of recurrent major depressive disorder (HCC)   [place] [facility] [place], Corrie Dandy T, NP   3 years ago Other hypertrophic cardiomyopathy (HCC)   [place] [facility] [place], Corrie Dandy T, NP   4 years ago Ocular migraine   [place] [facility] [place], Dorie Rank, NP       Future Appointments             In 5 months [place], Dorie Rank, NP [place] [facility]

## 2023-01-23 ENCOUNTER — Other Ambulatory Visit: Payer: Self-pay

## 2023-01-23 DIAGNOSIS — Z122 Encounter for screening for malignant neoplasm of respiratory organs: Secondary | ICD-10-CM

## 2023-01-23 DIAGNOSIS — F17210 Nicotine dependence, cigarettes, uncomplicated: Secondary | ICD-10-CM

## 2023-01-23 DIAGNOSIS — Z87891 Personal history of nicotine dependence: Secondary | ICD-10-CM

## 2023-01-30 ENCOUNTER — Ambulatory Visit: Payer: Medicare Other | Admitting: Acute Care

## 2023-01-30 DIAGNOSIS — F17210 Nicotine dependence, cigarettes, uncomplicated: Secondary | ICD-10-CM

## 2023-01-30 NOTE — Patient Instructions (Signed)

## 2023-01-30 NOTE — Progress Notes (Signed)
 Virtual Visit via Telephone Note  I connected with Vicki Rosales on 01/30/23 at  3:00 PM EDT by telephone and verified that I am speaking with the correct person using two identifiers.  Location: Patient: in home Provider: [address], [place], [place], [address]     Shared Decision Making Visit Lung Cancer Screening Program [phone])   Eligibility: Age 67 y.o. Pack Years Smoking History Calculation 44 (# packs/per year x # years smoked) Recent History of coughing up blood  no Unexplained weight loss? no ( >Than 15 pounds within the last 6 months ) Prior History Lung / other cancer no (Diagnosis within the last 5 years already requiring surveillance chest CT Scans). Smoking Status Current Smoker Former Smokers: Years since quit: NA  Quit Date: NA  Visit Components: Discussion included one or more decision making aids. yes Discussion included risk/benefits of screening. yes Discussion included potential follow up diagnostic testing for abnormal scans. yes Discussion included meaning and risk of over diagnosis. yes Discussion included meaning and risk of False Positives. yes Discussion included meaning of total radiation exposure. yes  Counseling Included: Importance of adherence to annual lung cancer LDCT screening. yes Impact of comorbidities on ability to participate in the program. yes Ability and willingness to under diagnostic treatment. yes  Smoking Cessation Counseling: Current Smokers:  Discussed importance of smoking cessation. yes Information about tobacco cessation classes and interventions provided to patient. yes Patient provided with "ticket" for LDCT Scan. yes Symptomatic Patient. no  Counseling NA Diagnosis Code: Tobacco Use Z72.0 Asymptomatic Patient yes  Counseling (Intermediate counseling: > three minutes counseling) U0454 Former Smokers:  Discussed the importance of maintaining cigarette abstinence. yes Diagnosis Code: Personal History  of Nicotine Dependence. U98.119 Information about tobacco cessation classes and interventions provided to patient. Yes Patient provided with "ticket" for LDCT Scan. yes Written Order for Lung Cancer Screening with LDCT placed in Epic. Yes (CT Chest Lung Cancer Screening Low Dose W/O CM) [id number] Z12.2-Screening of respiratory organs Z87.891-Personal history of nicotine dependence   Karl Bales, RN 01/30/23

## 2023-02-02 ENCOUNTER — Ambulatory Visit: Payer: Medicare Other

## 2023-02-10 ENCOUNTER — Ambulatory Visit: Payer: Medicare Other | Admitting: Family Medicine

## 2023-02-28 IMAGING — MR MR MRA NECK WO/W CM
1 of 2 series · 20 of 48 positions shown · IV contrast (6ml Gadavist)
Comparison: Head CT 05/29/2021. Head MRI, head MRA, and neck MRA
07/07/2012.

CLINICAL DATA: Neuro deficit, acute, stroke suspected. Left-sided
vision loss.

EXAM:
MRI HEAD WITHOUT CONTRAST
MRA HEAD WITHOUT CONTRAST
MRA OF THE NECK WITHOUT AND WITH CONTRAST
TECHNIQUE: Multiplanar, multi-echo pulse sequences of the brain and surrounding
structures were acquired without intravenous contrast. Angiographic
images of the Circle of Willis were acquired using MRA technique
without intravenous contrast. Angiographic images of the neck were
acquired using MRA technique without and with intravenous contrast.
Carotid stenosis measurements (when applicable) are obtained
utilizing NASCET criteria, using the distal internal carotid
diameter as the denominator.
CONTRAST:  7mL GADAVIST GADOBUTROL 1 MMOL/ML IV SOLN

[Series 21: angio_fl3d_cor_post_ttc=2.0s_moco-adv_sub · coronal · 0.9mm · 0.85mm/px · 20 of 96 slices shown]
[im 1/96]
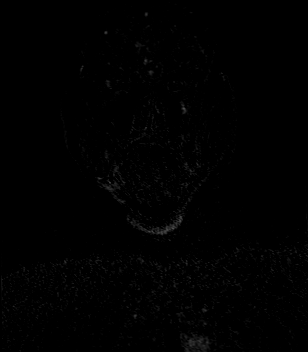
[im 6/96]
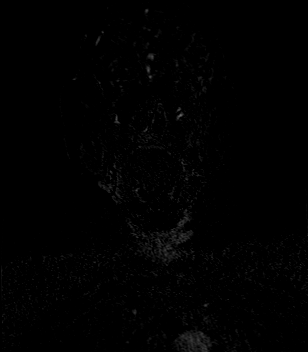
[im 11/96]
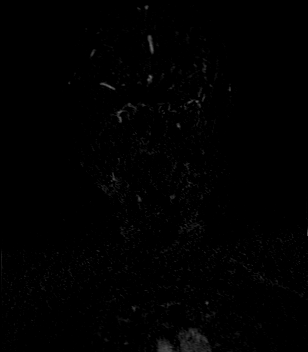
[im 16/96]
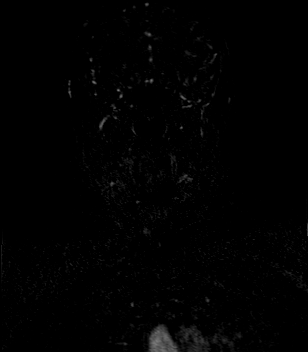
[im 21/96]
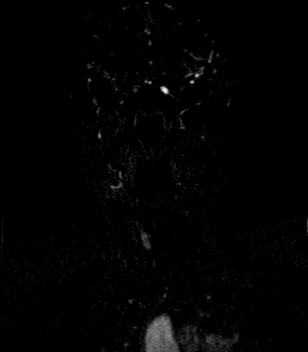
[im 26/96]
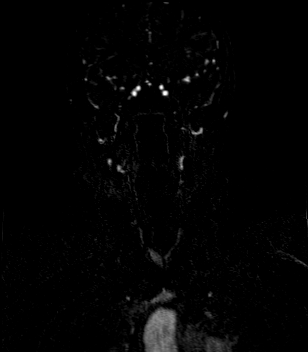
[im 31/96]
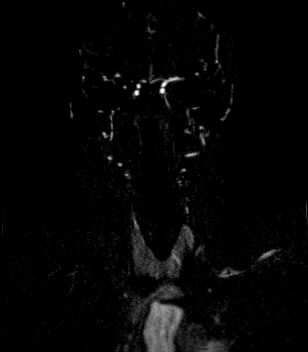
[im 36/96]
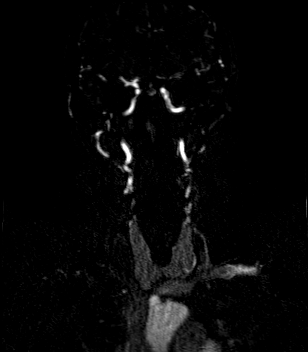
[im 41/96]
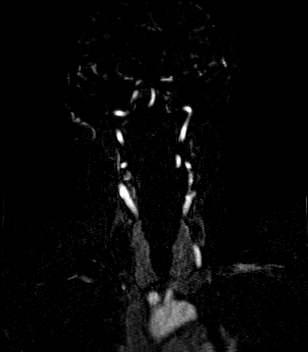
[im 46/96]
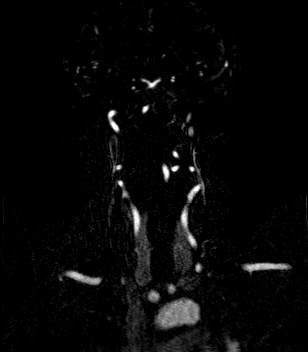
[im 51/96]
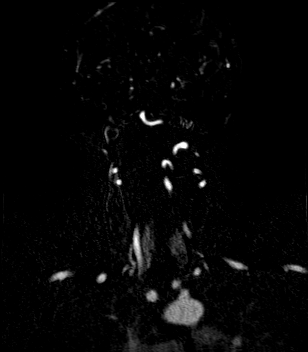
[im 56/96]
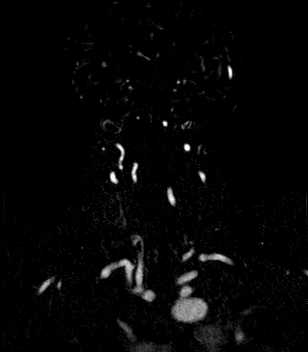
[im 61/96]
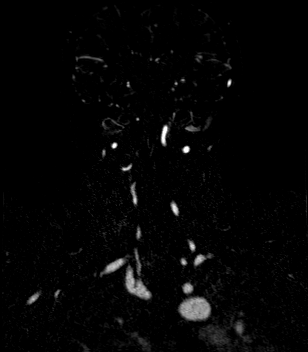
[im 66/96]
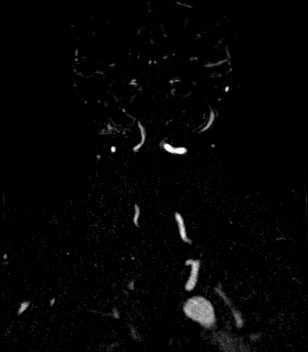
[im 71/96]
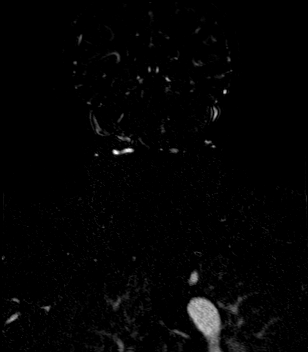
[im 76/96]
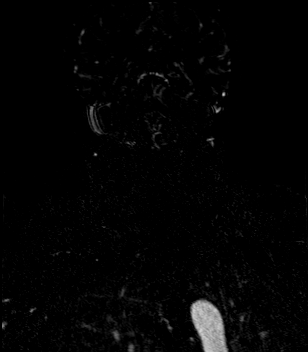
[im 81/96]
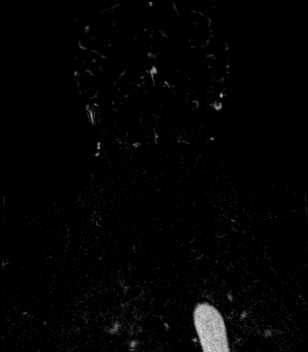
[im 86/96]
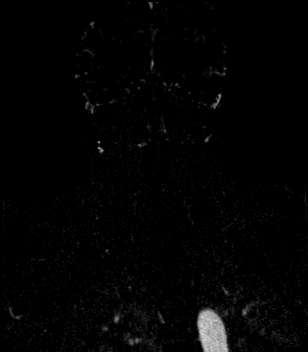
[im 91/96]
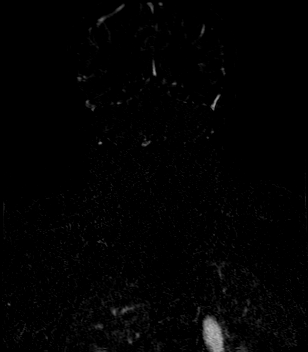
[im 96/96]
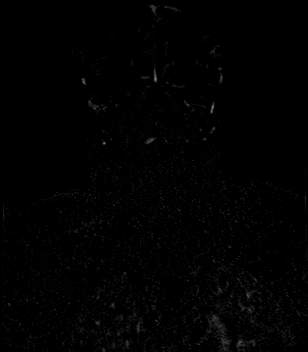

[20 of 48 positions shown; findings below may reference images not displayed]

FINDINGS: MR HEAD FINDINGS

Brain: There is no evidence of an acute infarct, mass, midline
shift, or extra-axial fluid collection. Chronic infarcts involving
the right frontal lobe primarily at the level of the operculum,
right insula, right corona radiata, and left occipital lobe are
unchanged from the prior MRI, and associated chronic blood products
are noted. A small chronic right occipital infarct is new. Small
chronic infarcts involving the left parietal lobe and right caudate
body are unchanged from the prior MRI, while chronic bilateral
cerebellar infarcts have increased in number. Moderate patchy T2
hyperintensity in the pons has mildly progressed and is nonspecific
but compatible with chronic small vessel ischemia. Mild cerebral
atrophy is within normal limits for age.

Vascular: Major intracranial vascular flow voids are preserved.

Skull and upper cervical spine: Unremarkable bone marrow signal.

Sinuses/Orbits: Unremarkable orbits. Paranasal sinuses and mastoid
air cells are clear.

Other: None.

MRA HEAD FINDINGS

Anterior circulation: The internal carotid arteries are widely
patent from skull base to carotid termini. ACAs and MCAs are patent
without evidence of a proximal branch occlusion or significant
proximal stenosis. No aneurysm is identified.

Posterior circulation: The included portions of the intracranial
vertebral arteries are widely patent to the basilar with the left
being dominant. Patent PICA, AICA, and SCA origins are seen
bilaterally. The basilar artery is widely patent. Posterior
communicating arteries are diminutive or absent. Both PCAs are
patent without evidence of a significant proximal stenosis. No
aneurysm is identified.

Anatomic variants:None.

MRA NECK FINDINGS

Aortic arch: Standard 3 vessel aortic arch. Widely patent
brachiocephalic and subclavian arteries.

Right carotid system: Patent without evidence of significant common
or internal carotid artery stenosis.

Left carotid system: Patent without evidence of significant common
or internal carotid artery stenosis.

Vertebral arteries: Patent with antegrade flow bilaterally.
Moderately dominant left vertebral artery. No evidence of a
significant stenosis or dissection.
IMPRESSION: 1. No acute intracranial abnormality.
2. Chronic ischemia with multiple old infarcts as above, progressed
3. Negative head MRA.
4. Negative neck MRA.

## 2023-06-19 ENCOUNTER — Ambulatory Visit: Payer: Self-pay | Admitting: Nurse Practitioner

## 2023-06-19 DIAGNOSIS — Z8673 Personal history of transient ischemic attack (TIA), and cerebral infarction without residual deficits: Secondary | ICD-10-CM

## 2023-06-19 DIAGNOSIS — F17210 Nicotine dependence, cigarettes, uncomplicated: Secondary | ICD-10-CM

## 2023-06-19 DIAGNOSIS — F330 Major depressive disorder, recurrent, mild: Secondary | ICD-10-CM

## 2023-06-19 DIAGNOSIS — F411 Generalized anxiety disorder: Secondary | ICD-10-CM

## 2023-06-19 DIAGNOSIS — E7800 Pure hypercholesterolemia, unspecified: Secondary | ICD-10-CM

## 2023-06-19 DIAGNOSIS — I422 Other hypertrophic cardiomyopathy: Secondary | ICD-10-CM

## 2023-06-19 DIAGNOSIS — G2581 Restless legs syndrome: Secondary | ICD-10-CM

## 2023-06-19 DIAGNOSIS — R7309 Other abnormal glucose: Secondary | ICD-10-CM

## 2023-06-19 DIAGNOSIS — I10 Essential (primary) hypertension: Secondary | ICD-10-CM

## 2023-07-13 ENCOUNTER — Ambulatory Visit: Admitting: Nurse Practitioner

## 2023-07-20 ENCOUNTER — Other Ambulatory Visit: Payer: Self-pay | Admitting: Nurse Practitioner

## 2023-07-21 ENCOUNTER — Other Ambulatory Visit: Payer: Self-pay | Admitting: Nurse Practitioner

## 2023-07-21 NOTE — Telephone Encounter (Signed)
 Requested Prescriptions  Pending Prescriptions Disp Refills   busPIRone  (BUSPAR ) 5 MG tablet [Pharmacy Med Name: busPIRone  HCl 5 MG Oral Tablet] 180 tablet 0    Sig: Take 1 tablet by mouth twice daily as needed     Psychiatry: Anxiolytics/Hypnotics - Non-controlled Failed - 07/21/2023  9:52 AM      Failed - Valid encounter within last 12 months    Recent Outpatient Visits   None

## 2023-07-22 NOTE — Telephone Encounter (Signed)
 Requested medication (s) are due for refill today:   Yes  Requested medication (s) are on the active medication list:   Yes  Future visit scheduled:   Yes 7/17 AWV     LOV was a video visit with Jolene on 12/19/2022.   Last ordered: 12/19/2022 #90, 1 refill  Unable to refill because labs are due.  Pt has cancelled multiple appts.    Provider to review for refills.   Requested Prescriptions  Pending Prescriptions Disp Refills   losartan  (COZAAR ) 25 MG tablet [Pharmacy Med Name: Losartan  Potassium 25 MG Oral Tablet] 30 tablet 0    Sig: Take 1 tablet by mouth once daily     Cardiovascular:  Angiotensin Receptor Blockers Failed - 07/22/2023  8:28 AM      Failed - Cr in normal range and within 180 days    Creatinine  Date Value Ref Range Status  07/06/2012 0.87 0.60 - 1.30 mg/dL Final   Creatinine, Ser  Date Value Ref Range Status  01/08/2023 1.04 (H) 0.57 - 1.00 mg/dL Final         Failed - K in normal range and within 180 days    Potassium  Date Value Ref Range Status  01/08/2023 4.5 3.5 - 5.2 mmol/L Final  07/06/2012 3.8 3.5 - 5.1 mmol/L Final         Failed - Last BP in normal range    BP Readings from Last 1 Encounters:  11/15/21 126/68         Failed - Valid encounter within last 6 months    Recent Outpatient Visits   None            Passed - Patient is not pregnant

## 2023-07-23 ENCOUNTER — Other Ambulatory Visit: Payer: Self-pay | Admitting: Nurse Practitioner

## 2023-07-23 NOTE — Telephone Encounter (Signed)
 Losartan  already approved, just needs the other two

## 2023-07-23 NOTE — Telephone Encounter (Signed)
 Copied from CRM [id number]. Topic: Clinical - Medication Refill >> Jul 23, 2023 10:20 AM Baldemar Lev wrote: Most Recent Primary Care Visit:  Provider: [facility]  Department: [facility]  Visit Type: LAB  Date: 01/08/2023  Medication: sertraline  (ZOLOFT ) 100 MG tablet clopidogrel  (PLAVIX ) 75 MG tablet losartan  (COZAAR ) 25 MG tablet  Has the patient contacted their pharmacy? Yes (Agent: If no, request that the patient contact the pharmacy for the refill. If patient does not wish to contact the pharmacy document the reason why and proceed with request.) (Agent: If yes, when and what did the pharmacy advise?)  Is this the correct pharmacy for this prescription? Yes If no, delete pharmacy and type the correct one.  This is the patient's preferred pharmacy:  [facility] [address], [place] - [address] [place] [address] Phone: [phone] Fax: [phone]   Has the prescription been filled recently? Yes  Is the patient out of the medication? Yes  Has the patient been seen for an appointment in the last year OR does the patient have an upcoming appointment? Yes  Can we respond through MyChart? Yes  Agent: Please be advised that Rx refills may take up to 3 business days. We ask that you follow-up with your pharmacy.

## 2023-07-24 MED ORDER — CLOPIDOGREL BISULFATE 75 MG PO TABS: 75.0000 mg | ORAL_TABLET | Freq: Every day | ORAL | 0 refills | Status: DC

## 2023-07-24 MED ORDER — SERTRALINE HCL 100 MG PO TABS: 100.0000 mg | ORAL_TABLET | Freq: Every day | ORAL | 1 refills | Status: DC

## 2023-07-24 NOTE — Telephone Encounter (Signed)
 Requested Prescriptions  Pending Prescriptions Disp Refills   clopidogrel  (PLAVIX ) 75 MG tablet 30 tablet 0    Sig: Take 1 tablet (75 mg total) by mouth daily.     Hematology: Antiplatelets - clopidogrel  Failed - 07/24/2023 10:32 AM      Failed - HCT in normal range and within 180 days    Hematocrit  Date Value Ref Range Status  01/08/2023 44.2 34.0 - 46.6 % Final         Failed - HGB in normal range and within 180 days    Hemoglobin  Date Value Ref Range Status  01/08/2023 14.9 11.1 - 15.9 g/dL Final         Failed - PLT in normal range and within 180 days    Platelets  Date Value Ref Range Status  01/08/2023 CANCELED x10E3/uL     Comment:    Unable to perform an accurate platelet count due to aggregation of the platelets.  Result canceled by the ancillary.          Failed - Cr in normal range and within 360 days    Creatinine  Date Value Ref Range Status  07/06/2012 0.87 0.60 - 1.30 mg/dL Final   Creatinine, Ser  Date Value Ref Range Status  01/08/2023 1.04 (H) 0.57 - 1.00 mg/dL Final         Failed - Valid encounter within last 6 months    Recent Outpatient Visits   None             sertraline  (ZOLOFT ) 100 MG tablet 90 tablet 1    Sig: Take 1 tablet (100 mg total) by mouth daily.     Psychiatry:  Antidepressants - SSRI - sertraline  Failed - 07/24/2023 10:32 AM      Failed - Valid encounter within last 6 months    Recent Outpatient Visits   None            Passed - AST in normal range and within 360 days    AST  Date Value Ref Range Status  01/08/2023 15 0 - 40 IU/L Final   SGOT(AST)  Date Value Ref Range Status  07/06/2012 22 15 - 37 Unit/L Final         Passed - ALT in normal range and within 360 days    ALT  Date Value Ref Range Status  01/08/2023 10 0 - 32 IU/L Final   SGPT (ALT)  Date Value Ref Range Status  07/06/2012 15 12 - 78 U/L Final         Passed - Completed PHQ-2 or PHQ-9 in the last 360 days

## 2023-07-28 NOTE — Telephone Encounter (Signed)
 Appt scheduled

## 2023-08-02 NOTE — Patient Instructions (Signed)

## 2023-08-05 ENCOUNTER — Ambulatory Visit (INDEPENDENT_AMBULATORY_CARE_PROVIDER_SITE_OTHER): Admitting: Nurse Practitioner

## 2023-08-05 ENCOUNTER — Encounter: Payer: Self-pay | Admitting: Nurse Practitioner

## 2023-08-05 VITALS — BP 128/75 | HR 86 | Temp 97.8°F | Wt 128.8 lb

## 2023-08-05 DIAGNOSIS — G2581 Restless legs syndrome: Secondary | ICD-10-CM

## 2023-08-05 DIAGNOSIS — I422 Other hypertrophic cardiomyopathy: Secondary | ICD-10-CM | POA: Diagnosis not present

## 2023-08-05 DIAGNOSIS — R7309 Other abnormal glucose: Secondary | ICD-10-CM

## 2023-08-05 DIAGNOSIS — I10 Essential (primary) hypertension: Secondary | ICD-10-CM

## 2023-08-05 DIAGNOSIS — E7800 Pure hypercholesterolemia, unspecified: Secondary | ICD-10-CM | POA: Diagnosis not present

## 2023-08-05 DIAGNOSIS — Z8673 Personal history of transient ischemic attack (TIA), and cerebral infarction without residual deficits: Secondary | ICD-10-CM

## 2023-08-05 DIAGNOSIS — F330 Major depressive disorder, recurrent, mild: Secondary | ICD-10-CM

## 2023-08-05 DIAGNOSIS — F17210 Nicotine dependence, cigarettes, uncomplicated: Secondary | ICD-10-CM

## 2023-08-05 DIAGNOSIS — F411 Generalized anxiety disorder: Secondary | ICD-10-CM

## 2023-08-05 LAB — MICROALBUMIN, URINE WAIVED
Creatinine, Urine Waived: 100 mg/dL (ref 10–300)
Microalb, Ur Waived: 30 mg/L — ABNORMAL HIGH (ref 0–19)
Microalb/Creat Ratio: <30 mg/g (ref <30)

## 2023-08-05 LAB — BAYER DCA HB A1C WAIVED: HB A1C (BAYER DCA - WAIVED): 6 % — ABNORMAL HIGH (ref 4.8–5.6)

## 2023-08-05 MED ORDER — LOSARTAN POTASSIUM 25 MG PO TABS: 25.0000 mg | ORAL_TABLET | Freq: Every day | ORAL | 1 refills | Status: DC

## 2023-08-05 MED ORDER — SERTRALINE HCL 100 MG PO TABS: 100.0000 mg | ORAL_TABLET | Freq: Every day | ORAL | 1 refills | Status: DC

## 2023-08-05 MED ORDER — ROSUVASTATIN CALCIUM 20 MG PO TABS: 20.0000 mg | ORAL_TABLET | Freq: Every day | ORAL | 3 refills | Status: AC

## 2023-08-05 MED ORDER — CLOPIDOGREL BISULFATE 75 MG PO TABS: 75.0000 mg | ORAL_TABLET | Freq: Every day | ORAL | 1 refills | Status: DC

## 2023-08-05 MED ORDER — ROPINIROLE HCL 2 MG PO TABS: 2.0000 mg | ORAL_TABLET | Freq: Every day | ORAL | 2 refills | Status: AC

## 2023-08-05 MED ORDER — BUSPIRONE HCL 5 MG PO TABS: 5.0000 mg | ORAL_TABLET | Freq: Two times a day (BID) | ORAL | 1 refills | Status: AC | PRN

## 2023-08-05 NOTE — Assessment & Plan Note (Signed)
 Chronic, stable.  BP was at goal last office visit.  Recommend she monitor BP at least a few mornings a week at home and document.  DASH diet at home.  Continue current medication regimen and adjust as needed.  Labs today: CBC, CMP, TSH, urine ALB.  Return in 6 months in office.  Refills sent in. Urine ALB 28 Aug 2023.

## 2023-08-05 NOTE — Assessment & Plan Note (Signed)
 I have recommended complete cessation of tobacco use. I have discussed various options available for assistance with tobacco cessation including over the counter methods (Nicotine gum, patch and lozenges). We also discussed prescription options (Chantix, Nicotine Inhaler / Nasal Spray). The patient is not interested in pursuing any prescription tobacco cessation options at this time.  Lung cancer screening order sent, but has not attended.

## 2023-08-05 NOTE — Progress Notes (Signed)
 BP 128/75   Pulse 86   Temp 97.8 F (36.6 C) (Oral)   Wt 128 lb 12.8 oz (58.4 kg)   SpO2 94%   BMI 25.37 kg/m    Subjective:    Patient ID: Vicki Rosales, female    DOB: April 08, 1955, 68 y.o.   MRN: [id number]  HPI: Vicki Rosales is a 68 y.o. female  Chief Complaint  Patient presents with   Anxiety   Depression   Hyperlipidemia   Hypertension   HYPERTENSION / HYPERLIPIDEMIA Takes Rosuvastatin  10 MG daily, Plavix  75 MG, Losartan  25 MG .  She is taking medications every day.  History of CVA's 2007, 2009, 2014. Has underlying hypertrophic cardiomyopathy.  Current smoker, smokes about <1 PPD, sometimes less. Has smoked since her teen years, tried to quit in past. Satisfied with current treatment? yes Duration of hypertension: chronic BP monitoring frequency: not checking BP range:  BP medication side effects: no Duration of hyperlipidemia: chronic Cholesterol medication side effects: no Cholesterol supplements: none Medication compliance: good compliance Aspirin : yes Recent stressors: no Recurrent headaches: no Visual changes: no Palpitations: no Dyspnea: no Chest pain: no Lower extremity edema: no Dizzy/lightheaded: no    RESTLESS LEG Takes Ropinirole  2 MG QHS.   Duration: chronic Discomfort description: crawling Pain: no Location: lower legs Bilateral: yes Symmetric: yes Severity: moderate Onset:  gradual Frequency:  intermittent Symptoms only occur while legs at rest: yes Sudden unintentional leg jerking: no Bed partner bothered by leg movements: no LE numbness: no Decreased sensation: no Weakness: no Insomnia: no Daytime somnolence: no Fatigue: no Alleviating factors: Requip  Aggravating factors: rest Status: stable Treatments attempted: Requip   Impaired Fasting Glucose HbA1C:  Lab Results  Component Value Date   HGBA1C 6.0 (H) 08/05/2023  Duration of elevated blood sugar: years Polydipsia: no Polyuria: no Weight change: no Visual  disturbance: no Glucose Monitoring: no    Accucheck frequency: Not Checking    Fasting glucose:     Post prandial:  Diabetic Education: Not Completed Family history of diabetes: yes -- sister   ANXIETY/STRESS Takes Sertraline  100 MG daily + Buspar .  She is both parent and grand parent at this time, babysitter often.  Helps takes care of her mother too, she is 85.  Duration: stable Anxious mood: at times Excessive worrying: no Irritability:at times Sweating: no Nausea: no Palpitations:no Hyperventilation: no Panic attacks: no Agoraphobia: no  Obscessions/compulsions: no Depressed mood: no    08/05/2023    3:43 PM 12/19/2022   10:23 AM 09/29/2022    3:31 PM 11/15/2021   11:08 AM 09/21/2020   10:57 AM  Depression screen PHQ 2/9  Decreased Interest 0 0 0 0 0  Down, Depressed, Hopeless 0 0 0 0 0  PHQ - 2 Score 0 0 0 0 0  Altered sleeping 0 0 0 1 0  Tired, decreased energy 0 1 0 0 0  Change in appetite 0 0 0 0 0  Feeling bad or failure about yourself  0 0 0 0 0  Trouble concentrating 0 0 0 0 0  Moving slowly or fidgety/restless 0 0 0 0 0  Suicidal thoughts 0 0 0 0 0  PHQ-9 Score 0 1 0 1 0  Difficult doing work/chores Not difficult at all Not difficult at all Not difficult at all Not difficult at all Not difficult at all  Anhedonia: no Weight changes: no Insomnia: none Hypersomnia: no Fatigue/loss of energy: no Feelings of worthlessness: no Feelings of guilt: no Impaired concentration/indecisiveness: no  Suicidal ideations: no  Crying spells: no Recent Stressors/Life Changes: no   Relationship problems: no   Family stress: no     Financial stress: no    Job stress: no    Recent death/loss: no    09/02/2023    3:43 PM 12/19/2022   10:24 AM 11/15/2021   11:08 AM 11/28/2019    9:44 AM  GAD 7 : Generalized Anxiety Score  Nervous, Anxious, on Edge 0 0 0 0  Control/stop worrying 0 1 1 0  Worry too much - different things 0 0 1 0  Trouble relaxing 0 0 0 0  Restless 0 0 0 0   Easily annoyed or irritable 0 0 0 0  Afraid - awful might happen 0 0 0 0  Total GAD 7 Score 0 1 2 0  Anxiety Difficulty Not difficult at all Not difficult at all Not difficult at all    Relevant past medical, surgical, family and social history reviewed and updated as indicated. Interim medical history since our last visit reviewed. Allergies and medications reviewed and updated.  Review of Systems  Constitutional:  Negative for activity change, appetite change, diaphoresis, fatigue and fever.  Respiratory:  Negative for cough, chest tightness and shortness of breath.   Cardiovascular:  Negative for chest pain, palpitations and leg swelling.  Gastrointestinal: Negative.   Endocrine: Negative for cold intolerance, heat intolerance, polydipsia, polyphagia and polyuria.  Neurological: Negative.   Psychiatric/Behavioral:  Negative for decreased concentration, self-injury, sleep disturbance and suicidal ideas. The patient is not nervous/anxious.     Per HPI unless specifically indicated above     Objective:    BP 128/75   Pulse 86   Temp 97.8 F (36.6 C) (Oral)   Wt 128 lb 12.8 oz (58.4 kg)   SpO2 94%   BMI 25.37 kg/m   Wt Readings from Last 3 Encounters:  September 02, 2023 128 lb 12.8 oz (58.4 kg)  09/29/22 143 lb (64.9 kg)  11/15/21 143 lb 1.6 oz (64.9 kg)    Physical Exam Vitals and nursing note reviewed.  Constitutional:      General: She is awake. She is not in acute distress.    Appearance: Normal appearance. She is well-developed and well-groomed. She is not ill-appearing or toxic-appearing.  HENT:     Head: Normocephalic.     Right Ear: Hearing normal.     Left Ear: Hearing normal.  Eyes:     General: Lids are normal.        Right eye: No discharge.        Left eye: No discharge.     Conjunctiva/sclera: Conjunctivae normal.  Pulmonary:     Effort: Pulmonary effort is normal. No accessory muscle usage or respiratory distress.  Musculoskeletal:     Cervical back: Normal  range of motion.  Neurological:     Mental Status: She is alert and oriented to person, place, and time.  Psychiatric:        Attention and Perception: Attention normal.        Mood and Affect: Mood normal.        Behavior: Behavior normal. Behavior is cooperative.        Thought Content: Thought content normal.        Judgment: Judgment normal.    Results for orders placed or performed in visit on 02-Sep-2023  Bayer DCA Hb A1c Waived   Collection Time: 09/02/2023  3:42 PM  Result Value Ref Range   HB A1C (BAYER DCA -  WAIVED) 6.0 (H) 4.8 - 5.6 %  Microalbumin, Urine Waived   Collection Time: 08/05/23  3:42 PM  Result Value Ref Range   Microalb, Ur Waived 30 (H) 0 - 19 mg/L   Creatinine, Urine Waived 100 10 - 300 mg/dL   Microalb/Creat Ratio <30 <30 mg/g      Assessment & Plan:   Problem List Items Addressed This Visit       Cardiovascular and Mediastinum   Other hypertrophic cardiomyopathy (HCC)   Ongoing, stable.  Continue ASA daily and recommend she take statin as instructed.  Would benefit return to cardiology, declines at this time.  Recommend complete cessation of smoking.      Relevant Medications   losartan  (COZAAR ) 25 MG tablet   rosuvastatin  (CRESTOR ) 20 MG tablet   Essential hypertension, benign   Chronic, stable.  BP was at goal last office visit.  Recommend she monitor BP at least a few mornings a week at home and document.  DASH diet at home.  Continue current medication regimen and adjust as needed.  Labs today: CBC, CMP, TSH, urine ALB.  Return in 6 months in office.  Refills sent in. Urine ALB 28 Aug 2023.       Relevant Medications   losartan  (COZAAR ) 25 MG tablet   rosuvastatin  (CRESTOR ) 20 MG tablet   Other Relevant Orders   CBC with Differential/Platelet   TSH     Other   RLS (restless legs syndrome)   Chronic, stable with Requip .  Refills sent in.  Will adjust dose as needed.  Recommend Magnesium 400 MG at night.      Relevant Orders   TSH    Vitamin B12   Magnesium   Nicotine dependence, cigarettes, uncomplicated   I have recommended complete cessation of tobacco use. I have discussed various options available for assistance with tobacco cessation including over the counter methods (Nicotine gum, patch and lozenges). We also discussed prescription options (Chantix, Nicotine Inhaler / Nasal Spray). The patient is not interested in pursuing any prescription tobacco cessation options at this time.  Lung cancer screening order sent, but has not attended.       Hyperlipidemia   Chronic, history of multiple strokes.  Continue Rosuvastatin  daily.  Script sent. Lipid panel today.  Discussed that she could take COQ10 as needed if concern for issues with ATP and statins.  Return in 6 months in office.      Relevant Medications   losartan  (COZAAR ) 25 MG tablet   rosuvastatin  (CRESTOR ) 20 MG tablet   Other Relevant Orders   Comprehensive metabolic panel with GFR   Lipid Panel w/o Chol/HDL Ratio   History of CVA (cerebrovascular accident)   CVA's in 2007, 2009, 2014.   WNL neuro exam today.  Educated her at length of stroke prevention goals including LDL <55 and BP <130/80.  Reiterated importance of taking medication daily.      Generalized anxiety disorder   Refer to depression plan of care.      Relevant Medications   busPIRone  (BUSPAR ) 5 MG tablet   sertraline  (ZOLOFT ) 100 MG tablet   Elevated hemoglobin A1c measurement   A1c trending down today from 6.2% to 6%.  Recommend she work heavily on diet, cutting back on pasta and potatoes + sweets.  Advised her to exercise 5 days a week for 30 minutes.  Urine ALB 28 Aug 2023.      Relevant Orders   Bayer DCA Hb A1c Waived (Completed)   Microalbumin,  Urine Waived (Completed)   Depression - Primary   Chronic, stable.  Denies SI/HI.  Continue Sertraline  daily and adjust dose as needed + continue Buspar  5 MG BID PRN.  Refills sent in.  Recommend notify provider immediately if worsening  mood.  Not to borrow relatives medications.  Return in 6 months, sooner if needed.      Relevant Medications   busPIRone  (BUSPAR ) 5 MG tablet   sertraline  (ZOLOFT ) 100 MG tablet      Follow up plan: Return in about 6 months (around 02/05/2024) for HTN/HLD, Depression, RLS -- plus needs Medicare Wellness after 09/29/23.

## 2023-08-05 NOTE — Assessment & Plan Note (Signed)
Chronic, stable.  Denies SI/HI.  Continue Sertraline daily and adjust dose as needed + continue Buspar 5 MG BID PRN.  Refills sent in.  Recommend notify provider immediately if worsening mood.  Not to borrow relatives medications.  Return in 6 months, sooner if needed.

## 2023-08-05 NOTE — Assessment & Plan Note (Signed)
 CVA's in 2007, 2009, 2014.   WNL neuro exam today.  Educated her at length of stroke prevention goals including LDL <55 and BP <130/80.  Reiterated importance of taking medication daily.

## 2023-08-05 NOTE — Assessment & Plan Note (Signed)
 Chronic, history of multiple strokes.  Continue Rosuvastatin  daily.  Script sent. Lipid panel today.  Discussed that she could take COQ10 as needed if concern for issues with ATP and statins.  Return in 6 months in office.

## 2023-08-05 NOTE — Assessment & Plan Note (Signed)
Chronic, stable with Requip.  Refills sent in.  Will adjust dose as needed.  Recommend Magnesium 400 MG at night.

## 2023-08-05 NOTE — Assessment & Plan Note (Signed)
 Refer to depression plan of care.

## 2023-08-05 NOTE — Assessment & Plan Note (Signed)
Ongoing, stable.  Continue ASA daily and recommend she take statin as instructed.  Would benefit return to cardiology, declines at this time.  Recommend complete cessation of smoking.

## 2023-08-05 NOTE — Assessment & Plan Note (Addendum)
 A1c trending down today from 6.2% to 6%.  Recommend she work heavily on diet, cutting back on pasta and potatoes + sweets.  Advised her to exercise 5 days a week for 30 minutes.  Urine ALB 28 Aug 2023.

## 2023-08-06 ENCOUNTER — Encounter: Payer: Self-pay | Admitting: Nurse Practitioner

## 2023-08-06 LAB — COMPREHENSIVE METABOLIC PANEL WITH GFR
ALT: 9 IU/L (ref 0–32)
AST: 17 IU/L (ref 0–40)
Albumin: 4.5 g/dL (ref 3.9–4.9)
Alkaline Phosphatase: 94 IU/L (ref 44–121)
BUN/Creatinine Ratio: 14 (ref 12–28)
BUN: 14 mg/dL (ref 8–27)
Bilirubin Total: 0.4 mg/dL (ref 0.0–1.2)
CO2: 21 mmol/L (ref 20–29)
Calcium: 9.4 mg/dL (ref 8.7–10.3)
Chloride: 101 mmol/L (ref 96–106)
Creatinine, Ser: 0.97 mg/dL (ref 0.57–1.00)
Globulin, Total: 2.7 g/dL (ref 1.5–4.5)
Glucose: 96 mg/dL (ref 70–99)
Potassium: 4.2 mmol/L (ref 3.5–5.2)
Sodium: 139 mmol/L (ref 134–144)
Total Protein: 7.2 g/dL (ref 6.0–8.5)
eGFR: 64 mL/min/{1.73_m2} (ref >59)

## 2023-08-06 LAB — CBC WITH DIFFERENTIAL/PLATELET
Basophils Absolute: 0.1 10*3/uL (ref 0.0–0.2)
Basos: 1 %
EOS (ABSOLUTE): 0.1 10*3/uL (ref 0.0–0.4)
Eos: 1 %
Hematocrit: 44.6 % (ref 34.0–46.6)
Hemoglobin: 14.6 g/dL (ref 11.1–15.9)
Immature Grans (Abs): 0 10*3/uL (ref 0.0–0.1)
Immature Granulocytes: 0 %
Lymphocytes Absolute: 2.5 10*3/uL (ref 0.7–3.1)
Lymphs: 42 %
MCH: 29.6 pg (ref 26.6–33.0)
MCHC: 32.7 g/dL (ref 31.5–35.7)
MCV: 91 fL (ref 79–97)
Monocytes Absolute: 0.4 10*3/uL (ref 0.1–0.9)
Monocytes: 6 %
Neutrophils Absolute: 3 10*3/uL (ref 1.4–7.0)
Neutrophils: 50 %
RBC: 4.93 x10E6/uL (ref 3.77–5.28)
RDW: 13 % (ref 11.7–15.4)
WBC: 5.9 10*3/uL (ref 3.4–10.8)

## 2023-08-06 LAB — LIPID PANEL W/O CHOL/HDL RATIO
Cholesterol, Total: 148 mg/dL (ref 100–199)
HDL: 50 mg/dL (ref >39)
LDL Chol Calc (NIH): 77 mg/dL (ref 0–99)
Triglycerides: 120 mg/dL (ref 0–149)
VLDL Cholesterol Cal: 21 mg/dL (ref 5–40)

## 2023-08-06 LAB — VITAMIN B12: Vitamin B-12: 679 pg/mL (ref 232–1245)

## 2023-08-06 LAB — TSH: TSH: 0.875 u[IU]/mL (ref 0.450–4.500)

## 2023-08-06 LAB — MAGNESIUM: Magnesium: 2.3 mg/dL (ref 1.6–2.3)

## 2023-08-06 NOTE — Progress Notes (Signed)
 Contacted via MyChart   Good afternoon Vicki Rosales, your labs have returned and overall are stable.  Continue all medications at this time.  Any questions? Keep being amazing!!  Thank you for allowing me to participate in your care.  I appreciate you. Kindest regards, Roseland Braun

## 2023-12-11 ENCOUNTER — Encounter: Payer: Self-pay | Admitting: Nurse Practitioner

## 2024-03-30 ENCOUNTER — Other Ambulatory Visit: Payer: Self-pay | Admitting: Nurse Practitioner

## 2024-04-27 ENCOUNTER — Other Ambulatory Visit: Payer: Self-pay | Admitting: Nurse Practitioner

## 2024-04-28 NOTE — Telephone Encounter (Signed)
 Requested medication (s) are due for refill today: yes  Requested medication (s) are on the active medication list: yes  Last refill:  03/30/24  Future visit scheduled: no  Notes to clinic:  Unable to refill per protocol, courtesy refill already given, routing for provider approval.      Requested Prescriptions  Pending Prescriptions Disp Refills   losartan  (COZAAR ) 25 MG tablet [Pharmacy Med Name: Losartan  Potassium 25 MG Oral Tablet] 30 tablet 0    Sig: TAKE 1 TABLET BY MOUTH ONCE DAILY . APPOINTMENT REQUIRED FOR FUTURE REFILLS     Cardiovascular:  Angiotensin Receptor Blockers Failed - 04/28/2024 12:12 PM      Failed - Cr in normal range and within 180 days    Creatinine  Date Value Ref Range Status  07/06/2012 0.87 0.60 - 1.30 mg/dL Final   Creatinine, Ser  Date Value Ref Range Status  08/05/2023 0.97 0.57 - 1.00 mg/dL Final         Failed - K in normal range and within 180 days    Potassium  Date Value Ref Range Status  08/05/2023 4.2 3.5 - 5.2 mmol/L Final  07/06/2012 3.8 3.5 - 5.1 mmol/L Final         Failed - Valid encounter within last 6 months    Recent Outpatient Visits           8 months ago Mild episode of recurrent major depressive disorder   [facility] [place], Jolene T, NP              Passed - Patient is not pregnant      Passed - Last BP in normal range    BP Readings from Last 1 Encounters:  08/05/23 128/75

## 2024-04-29 ENCOUNTER — Other Ambulatory Visit: Payer: Self-pay | Admitting: Nurse Practitioner

## 2024-04-29 NOTE — Telephone Encounter (Unsigned)
 Copied from CRM #[id number]. Topic: Clinical - Medication Refill >> Apr 29, 2024  9:10 AM Wess RAMAN wrote: Medication: losartan  (COZAAR ) 25 MG tablet  sertraline  (ZOLOFT ) 100 MG tablet  clopidogrel  (PLAVIX ) 75 MG tablet   Has the patient contacted their pharmacy? Yes (Agent: If no, request that the patient contact the pharmacy for the refill. If patient does not wish to contact the pharmacy document the reason why and proceed with request.) (Agent: If yes, when and what did the pharmacy advise?) Contact doctor  This is the patient's preferred pharmacy:  [facility] [address] [place] - [address] [place] [address] Phone: [phone] Fax: [phone]  Is this the correct pharmacy for this prescription? Yes If no, delete pharmacy and type the correct one.   Has the prescription been filled recently? No  Is the patient out of the medication? No  Has the patient been seen for an appointment in the last year OR does the patient have an upcoming appointment? Yes  Can we respond through MyChart? Yes  Agent: Please be advised that Rx refills may take up to 3 business days. We ask that you follow-up with your pharmacy.

## 2024-04-30 NOTE — Patient Instructions (Incomplete)
 Be Involved in Caring For Your Health:  Taking Medications When medications are taken as directed, they can greatly improve your health. But if they are not taken as prescribed, they may not work. In some cases, not taking them correctly can be harmful. To help ensure your treatment remains effective and safe, understand your medications and how to take them. Bring your medications to each visit for review by your provider.  Your lab results, notes, and after visit summary will be available on My Chart. We strongly encourage you to use this feature. If lab results are abnormal the clinic will contact you with the appropriate steps. If the clinic does not contact you assume the results are satisfactory. You can always view your results on My Chart. If you have questions regarding your health or results, please contact the clinic during office hours. You can also ask questions on My Chart.  We at [facility] are grateful that you chose us  to provide your care. We strive to provide evidence-based and compassionate care and are always looking for feedback. If you get a survey from the clinic please complete this so we can hear your opinions.   Healthy Eating for Your Heart Many factors influence your heart health, including eating and exercise habits. Heart health is also called coronary health. Coronary risk increases with abnormal blood fat (lipid) levels. A heart-healthy eating plan includes limiting unhealthy fats, increasing healthy fats, limiting salt (sodium) intake, and making other diet and lifestyle changes. What is my plan? Your health care provider may recommend that: You limit your fat intake to _________% or less of your total calories each day. You limit your saturated fat intake to _________% or less of your total calories each day. You limit the amount of cholesterol in your diet to less than _________ mg per day. You limit the amount of sodium in your diet to less than  _________ mg per day. What are tips for following this plan? Cooking Cook foods using methods other than frying. Baking, boiling, grilling, and broiling are all good options. Other ways to reduce fat include: Removing the skin from poultry. Removing all visible fats from meats. Steaming vegetables in water  or broth. Meal planning  At meals, imagine dividing your plate into fourths: Fill one-half of your plate with vegetables and green salads. Fill one-fourth of your plate with whole grains. Fill one-fourth of your plate with lean protein foods. Eat 2-4 cups of vegetables per day. One cup of vegetables equals 1 cup (91 g) broccoli or cauliflower florets, 2 medium carrots, 1 large bell pepper, 1 large sweet potato, 1 large tomato, 1 medium white potato, 2 cups (150 g) raw leafy greens. Eat 1-2 cups of fruit per day. One cup of fruit equals 1 small apple, 1 large banana, 1 cup (237 g) mixed fruit, 1 large orange,  cup (82 g) dried fruit, 1 cup (240 mL) 100% fruit juice. Eat more foods that contain soluble fiber. Examples include apples, broccoli, carrots, beans, peas, and barley. Aim to get 25-30 g of fiber per day. Increase your consumption of legumes, nuts, and seeds to 4-5 servings per week. One serving of dried beans or legumes equals  cup (90 g) cooked, 1 serving of nuts is  oz (12 almonds, 24 pistachios, or 7 walnut halves), and 1 serving of seeds equals  oz (8 g). Fats Choose healthy fats more often. Choose monounsaturated and polyunsaturated fats, such as olive and canola oils, avocado oil, flaxseeds, walnuts, almonds,  and seeds. Eat more omega-3 fats. Choose salmon, mackerel, sardines, tuna, flaxseed oil, and ground flaxseeds. Aim to eat fish at least 2 times each week. Check food labels carefully to identify foods with trans fats or high amounts of saturated fat. Limit saturated fats. These are found in animal products, such as meats, butter, and cream. Plant sources of saturated  fats include palm oil, palm kernel oil, and coconut oil. Avoid foods with partially hydrogenated oils in them. These contain trans fats. Examples are stick margarine, some tub margarines, cookies, crackers, and other baked goods. Avoid fried foods. General information Eat more home-cooked food and less restaurant, buffet, and fast food. Limit or avoid alcohol. Limit foods that are high in added sugar and simple starches such as foods made using white refined flour (white breads, pastries, sweets). Lose weight if you are overweight. Losing just 5-10% of your body weight can help your overall health and prevent diseases such as diabetes and heart disease. Monitor your sodium intake, especially if you have high blood pressure. Talk with your health care provider about your sodium intake. Try to incorporate more vegetarian meals weekly. What foods should I eat? Fruits All fresh, canned (in natural juice), or frozen fruits. Vegetables Fresh or frozen vegetables (raw, steamed, roasted, or grilled). Green salads. Grains Most grains. Choose whole wheat and whole grains most of the time. Rice and pasta, including brown rice and pastas made with whole wheat. Meats and other proteins Lean, well-trimmed beef, veal, pork, and lamb. Chicken and turkey without skin. All fish and shellfish. Wild duck, rabbit, pheasant, and venison. Egg whites or low-cholesterol egg substitutes. Dried beans, peas, lentils, and tofu. Seeds and most nuts. Dairy Low-fat or nonfat cheeses, including ricotta and mozzarella. Skim or 1% milk (liquid, powdered, or evaporated). Buttermilk made with low-fat milk. Nonfat or low-fat yogurt. Fats and oils Non-hydrogenated (trans-free) margarines. Vegetable oils, including soybean, sesame, sunflower, olive, avocado, peanut, safflower, corn, canola, and cottonseed. Salad dressings or mayonnaise made with a vegetable oil. Beverages Water  (mineral or sparkling). Coffee and tea. Unsweetened  ice tea. Diet beverages. Sweets and desserts Sherbet, gelatin, and fruit ice. Small amounts of dark chocolate. Limit all sweets and desserts. Seasonings and condiments All seasonings and condiments. The items listed above may not be a complete list of foods and beverages you can eat. Contact a dietitian for more options. What foods should I avoid? Fruits Canned fruit in heavy syrup. Fruit in cream or butter sauce. Fried fruit. Limit coconut. Vegetables Vegetables cooked in cheese, cream, or butter sauce. Fried vegetables. Grains Breads made with saturated or trans fats, oils, or whole milk. Croissants. Sweet rolls. Donuts. High-fat crackers, such as cheese crackers and chips. Meats and other proteins Fatty meats, such as hot dogs, ribs, sausage, bacon, rib-eye roast or steak. High-fat deli meats, such as salami and bologna. Caviar. Domestic duck and goose. Organ meats, such as liver. Dairy Cream, sour cream, cream cheese, and creamed cottage cheese. Whole-milk cheeses. Whole or 2% milk (liquid, evaporated, or condensed). Whole buttermilk. Cream sauce or high-fat cheese sauce. Whole-milk yogurt. Fats and oils Meat fat, or shortening. Cocoa butter, hydrogenated oils, palm oil, coconut oil, palm kernel oil. Solid fats and shortenings, including bacon fat, salt pork, lard, and butter. Nondairy cream substitutes. Salad dressings with cheese or sour cream. Beverages Regular sodas and any drinks with added sugar. Sweets and desserts Frosting. Pudding. Cookies. Cakes. Pies. Milk chocolate or white chocolate. Buttered syrups. Full-fat ice cream or ice cream drinks. The items  listed above may not be a complete list of foods and beverages to avoid. Contact a dietitian for more information. Summary Heart-healthy meal planning includes limiting unhealthy fats, increasing healthy fats, limiting salt (sodium) intake and making other diet and lifestyle changes. Lose weight if you are overweight. Losing  just 5-10% of your body weight can help your overall health and prevent diseases such as diabetes and heart disease. Focus on eating a balance of foods, including fruits and vegetables, low-fat or nonfat dairy, lean protein, nuts and legumes, whole grains, and heart-healthy oils and fats. This information is not intended to replace advice given to you by your health care provider. Make sure you discuss any questions you have with your health care provider. Document Revised: 01/25/2024 Document Reviewed: 04/22/2021 Elsevier Patient Education  2025 [employer].

## 2024-05-03 ENCOUNTER — Ambulatory Visit: Admitting: Nurse Practitioner

## 2024-05-03 ENCOUNTER — Other Ambulatory Visit: Payer: Self-pay | Admitting: Nurse Practitioner

## 2024-05-03 DIAGNOSIS — F17210 Nicotine dependence, cigarettes, uncomplicated: Secondary | ICD-10-CM

## 2024-05-03 DIAGNOSIS — E7800 Pure hypercholesterolemia, unspecified: Secondary | ICD-10-CM

## 2024-05-03 DIAGNOSIS — G2581 Restless legs syndrome: Secondary | ICD-10-CM

## 2024-05-03 DIAGNOSIS — I422 Other hypertrophic cardiomyopathy: Secondary | ICD-10-CM

## 2024-05-03 DIAGNOSIS — F411 Generalized anxiety disorder: Secondary | ICD-10-CM

## 2024-05-03 DIAGNOSIS — R7309 Other abnormal glucose: Secondary | ICD-10-CM

## 2024-05-03 DIAGNOSIS — Z8673 Personal history of transient ischemic attack (TIA), and cerebral infarction without residual deficits: Secondary | ICD-10-CM

## 2024-05-03 DIAGNOSIS — I10 Essential (primary) hypertension: Secondary | ICD-10-CM

## 2024-05-03 DIAGNOSIS — F330 Major depressive disorder, recurrent, mild: Secondary | ICD-10-CM

## 2024-05-03 MED ORDER — SERTRALINE HCL 100 MG PO TABS: 100.0000 mg | ORAL_TABLET | Freq: Every day | ORAL | 0 refills | Status: AC

## 2024-05-03 MED ORDER — LOSARTAN POTASSIUM 25 MG PO TABS: 25.0000 mg | ORAL_TABLET | Freq: Every day | ORAL | 0 refills | Status: AC

## 2024-05-03 MED ORDER — CLOPIDOGREL BISULFATE 75 MG PO TABS: 75.0000 mg | ORAL_TABLET | Freq: Every day | ORAL | 0 refills | Status: AC

## 2024-05-04 NOTE — Telephone Encounter (Signed)
 Requested Prescriptions  Pending Prescriptions Disp Refills   losartan  (COZAAR ) 25 MG tablet [Pharmacy Med Name: Losartan  Potassium 25 MG Oral Tablet] 30 tablet 0    Sig: TAKE 1 TABLET BY MOUTH ONCE DAILY . APPOINTMENT REQUIRED FOR FUTURE REFILLS     Cardiovascular:  Angiotensin Receptor Blockers Failed - 05/04/2024  4:40 PM      Failed - Cr in normal range and within 180 days    Creatinine  Date Value Ref Range Status  07/06/2012 0.87 0.60 - 1.30 mg/dL Final   Creatinine, Ser  Date Value Ref Range Status  08/05/2023 0.97 0.57 - 1.00 mg/dL Final         Failed - K in normal range and within 180 days    Potassium  Date Value Ref Range Status  08/05/2023 4.2 3.5 - 5.2 mmol/L Final  07/06/2012 3.8 3.5 - 5.1 mmol/L Final         Failed - Valid encounter within last 6 months    Recent Outpatient Visits           9 months ago Mild episode of recurrent major depressive disorder   [facility] [place], Jolene T, NP              Passed - Patient is not pregnant      Passed - Last BP in normal range    BP Readings from Last 1 Encounters:  08/05/23 128/75

## 2024-07-05 ENCOUNTER — Ambulatory Visit: Admitting: Nurse Practitioner
